# Patient Record
Sex: Female | Born: 1935 | Race: White | Hispanic: No | Smoking: Never smoker
Health system: Southern US, Community
[De-identification: ages and names within clinical notes are randomized; demographics above are authoritative.]

## PROBLEM LIST (undated history)

## (undated) DIAGNOSIS — M1990 Unspecified osteoarthritis, unspecified site: Secondary | ICD-10-CM

## (undated) DIAGNOSIS — C801 Malignant (primary) neoplasm, unspecified: Secondary | ICD-10-CM

## (undated) DIAGNOSIS — E7800 Pure hypercholesterolemia, unspecified: Secondary | ICD-10-CM

## (undated) DIAGNOSIS — F32A Depression, unspecified: Secondary | ICD-10-CM

## (undated) DIAGNOSIS — M545 Low back pain, unspecified: Secondary | ICD-10-CM

## (undated) DIAGNOSIS — F329 Major depressive disorder, single episode, unspecified: Secondary | ICD-10-CM

## (undated) DIAGNOSIS — F419 Anxiety disorder, unspecified: Secondary | ICD-10-CM

## (undated) DIAGNOSIS — K219 Gastro-esophageal reflux disease without esophagitis: Secondary | ICD-10-CM

## (undated) HISTORY — PX: FRACTURE SURGERY: SHX138

## (undated) HISTORY — PX: EYE SURGERY: SHX253

## (undated) HISTORY — PX: COLONOSCOPY: SHX174

## (undated) HISTORY — PX: CATARACT EXTRACTION, BILATERAL: SHX1313

## (undated) HISTORY — PX: JOINT REPLACEMENT: SHX530

## (undated) HISTORY — PX: DILATION AND CURETTAGE OF UTERUS: SHX78

---

## 2005-04-27 ENCOUNTER — Inpatient Hospital Stay: Payer: Self-pay | Admitting: Specialist

## 2005-04-27 ENCOUNTER — Other Ambulatory Visit: Payer: Self-pay

## 2011-06-16 ENCOUNTER — Ambulatory Visit: Payer: Self-pay | Admitting: Nurse Practitioner

## 2014-04-22 ENCOUNTER — Ambulatory Visit: Payer: Self-pay | Admitting: Obstetrics & Gynecology

## 2014-04-22 LAB — BASIC METABOLIC PANEL
Anion Gap: 8 (ref 7–16)
BUN: 11 mg/dL (ref 7–18)
CALCIUM: 8.9 mg/dL (ref 8.5–10.1)
CHLORIDE: 106 mmol/L (ref 98–107)
CREATININE: 0.85 mg/dL (ref 0.60–1.30)
Co2: 28 mmol/L (ref 21–32)
EGFR (African American): >60
Glucose: 95 mg/dL (ref 65–99)
OSMOLALITY: 282 (ref 275–301)
POTASSIUM: 3.8 mmol/L (ref 3.5–5.1)
Sodium: 142 mmol/L (ref 136–145)

## 2014-04-22 LAB — HEMOGLOBIN: HGB: 13.6 g/dL (ref 12.0–16.0)

## 2014-04-24 ENCOUNTER — Ambulatory Visit: Payer: Self-pay | Admitting: Obstetrics & Gynecology

## 2014-10-10 NOTE — Op Note (Signed)
PATIENT NAME:  Molly Fisher, Molly Fisher MR#:  [id number] DATE OF BIRTH:  [birth year]  DATE OF PROCEDURE:  04/24/2014  PREOPERATIVE DIAGNOSIS:  Postmenopausal bleeding.  POSTPARTUM DIAGNOSIS: Postmenopausal bleeding.  SURGEON:  Larey Days, MD.  ANESTHESIA:  LMA.  PROCEDURE:   1. Dilation and curettage. 2. Hysteroscopy. 3. Cystoscopy.  FLUIDS:  In were crystalloids, urine out was zero.  BLOOD LOSS: Minimal.   SPECIMENS:  Endometrial curettings.   FINDINGS:   1. Fragile tissue in the vagina and external cervix. 2. Uterus sounded to 7 mm. 3. Atrophic endometrium. 4. Scant endometrial curettings. 5. Normal appearing bladder with bilateral ureteral jets and increased vascularity at the trigone. 6. Very small, less than 1 mm, pale-colored nodule near the left UO.  DISPOSITION:  Stable to PACU.  INDICATIONS FOR PROCEDURE: The patient is an 81 year old female who presented as an outpatient for workup of postmenopausal bleeding. The bleeding was described as bright red and few dots versus dark red or brown and voluminous, leading to the question as to whether this was from the endometrium or perhaps the external genitalia or urethra. The patient has been incontinent with no sensation of urine expression for many years. An ultrasound was performed that showed a thickened endometrial stripe at 6 to 7 mm as well as an echogenic finding within the endometrium. Due to this, the patient was recommended a sampling of the uterus, and due to the finding of the echogenic material, it was recommended for a hysteroscopic evaluation. Due to the nature of the bleeding and its uncertainty as well as her past for urinary incontinence, the possibility that this was urinary or urethral in nature existed, thus the indication for the cystoscopy.   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 She was moved to the operating table. She was given  general anesthesia via LMA and once she was adequately sedated, she was placed in the dorsal lithotomy position using Allen stirrups. She was prepped and draped in the usual sterile fashion using Betadine. A sterile vaginal and pelvic exam was performed, noting an anteverted uterus with good mobility. No rectovaginal nodularity and no pelvic fullness or adnexal masses. A surgical timeout was called. The gray speculum was placed into the vagina, where upon opening it, it was noted that the patient was bleeding from the ectocervix. A sponge stick was used to wipe away the blood in order to delineate its origin.   The external surface of the cervix has been abraded during the prep and was bleeding superficially. There was no blood coming from the cervical os. Pratt dilators were used to identify the cervical os and the uterus was sounded to 7 cm. Again, Pratt dilators were used sequentially to dilate the cervix in order to accommodate the hysteroscope, which was inserted and the uterine cavity was visualized with findings as above. The hysteroscope was removed and then gentle curettage was performed with a scan amount of tissue returned. This was handed off as endometrial curettings. The hysteroscope was then reinserted to confirm that there were no remaining areas of tissue that could be removed. There were no polyps or solid material within the cavity.  Once this was complete, the hysteroscope was exchanged for a cystoscope. This was then inserted into the urethra and 300 mL of normal saline was irrigated into the bladder. The dome and the surrounding walls of the bladder were evaluated and found to be normal. The trigone, however,  had increased vascularity and to the left of the ureteral orifice was a very small, pale nodule that had no vascularity to it. Both ureteral orifices were found to have efflux and the hysteroscope was then removed. A red rubber catheter was then placed into the bladder in order to  evacuate the remaining fluid. That was the end of the procedure. The patient was cleaned. She tolerated the procedure. The counts were correct x 2. She was recovered and brought to PACU in a stable condition from recovery.  Please note, the patient had an episode of bradycardia soon after induction of anesthesia. No atropine was used. She recovered spontaneously and throughout the remainder of the case, maintained a normal heart rate.         ____________________________ Honor Loh. Ward, MD ccw:TT D: 04/24/2014 15:09:19 ET T: 04/24/2014 15:22:14 ET JOB#: [id number]  cc: Chelsea C. Ward, MD, <Dictator> Maceo Pro MD ELECTRONICALLY SIGNED 05/05/2014 9:22

## 2014-10-12 LAB — SURGICAL PATHOLOGY

## 2014-12-25 ENCOUNTER — Emergency Department: Payer: Medicare Other

## 2014-12-25 ENCOUNTER — Emergency Department
Admission: EM | Admit: 2014-12-25 | Discharge: 2014-12-25 | Disposition: A | Discharge status: Home / Self Care | Payer: Medicare Other | Attending: Emergency Medicine | Admitting: Emergency Medicine

## 2014-12-25 DIAGNOSIS — S0093XA Contusion of unspecified part of head, initial encounter: Secondary | ICD-10-CM | POA: Insufficient documentation

## 2014-12-25 DIAGNOSIS — Y998 Other external cause status: Secondary | ICD-10-CM | POA: Insufficient documentation

## 2014-12-25 DIAGNOSIS — Y9389 Activity, other specified: Secondary | ICD-10-CM | POA: Insufficient documentation

## 2014-12-25 DIAGNOSIS — W01198A Fall on same level from slipping, tripping and stumbling with subsequent striking against other object, initial encounter: Secondary | ICD-10-CM | POA: Insufficient documentation

## 2014-12-25 DIAGNOSIS — S01111A Laceration without foreign body of right eyelid and periocular area, initial encounter: Secondary | ICD-10-CM | POA: Diagnosis not present

## 2014-12-25 DIAGNOSIS — S0990XA Unspecified injury of head, initial encounter: Secondary | ICD-10-CM | POA: Diagnosis present

## 2014-12-25 DIAGNOSIS — Y9289 Other specified places as the place of occurrence of the external cause: Secondary | ICD-10-CM | POA: Insufficient documentation

## 2014-12-25 DIAGNOSIS — H05231 Hemorrhage of right orbit: Secondary | ICD-10-CM

## 2014-12-25 MED ORDER — DIAZEPAM 5 MG/ML IJ SOLN
INTRAMUSCULAR | Status: AC
  Filled 2014-12-25: qty 2

## 2014-12-25 MED ORDER — DIAZEPAM 5 MG/ML IJ SOLN
5.0000 mg | Freq: Once | INTRAMUSCULAR | Status: AC
  Administered 2014-12-25: 5 mg via INTRAMUSCULAR

## 2014-12-25 MED ORDER — DIAZEPAM 5 MG/ML IJ SOLN
INTRAMUSCULAR | Status: AC
  Administered 2014-12-25: 5 mg via INTRAMUSCULAR
  Filled 2014-12-25: qty 2

## 2014-12-25 MED ORDER — LIDOCAINE-EPINEPHRINE-TETRACAINE (LET) SOLUTION
3.0000 mL | Freq: Once | NASAL | Status: AC
  Administered 2014-12-25: 3 mL via TOPICAL

## 2014-12-25 MED ORDER — LIDOCAINE-EPINEPHRINE-TETRACAINE (LET) SOLUTION
NASAL | Status: AC
  Administered 2014-12-25: 3 mL via TOPICAL
  Filled 2014-12-25: qty 3

## 2014-12-25 NOTE — ED Notes (Signed)
Patient reports she was walking and the wind was blowing and she got turned around and fell striking head on carport.  Pt denies loss of consciousness.  Pt with laceration noted above right eye brow, bruising and swelling noted around right eye.  Pt denies taking any type of blood thinner.

## 2014-12-25 NOTE — Discharge Instructions (Signed)
Contusion A contusion is a deep bruise. Contusions are the result of an injury that caused bleeding under the skin. The contusion may turn blue, purple, or yellow. Minor injuries will give you a painless contusion, but more severe contusions may stay painful and swollen for a few weeks.  CAUSES  A contusion is usually caused by a blow, trauma, or direct force to an area of the body. SYMPTOMS   Swelling and redness of the injured area.  Bruising of the injured area.  Tenderness and soreness of the injured area.  Pain. DIAGNOSIS  The diagnosis can be made by taking a history and physical exam. An X-ray, CT scan, or MRI may be needed to determine if there were any associated injuries, such as fractures. TREATMENT  Specific treatment will depend on what area of the body was injured. In general, the best treatment for a contusion is resting, icing, elevating, and applying cold compresses to the injured area. Over-the-counter medicines may also be recommended for pain control. Ask your caregiver what the best treatment is for your contusion. HOME CARE INSTRUCTIONS   Put ice on the injured area.  Put ice in a plastic bag.  Place a towel between your skin and the bag.  Leave the ice on for 15-20 minutes, 3-4 times a day, or as directed by your health care provider.  Only take over-the-counter or prescription medicines for pain, discomfort, or fever as directed by your caregiver. Your caregiver may recommend avoiding anti-inflammatory medicines (aspirin, ibuprofen, and naproxen) for 48 hours because these medicines may increase bruising.  Rest the injured area.  If possible, elevate the injured area to reduce swelling. SEEK IMMEDIATE MEDICAL CARE IF:   You have increased bruising or swelling.  You have pain that is getting worse.  Your swelling or pain is not relieved with medicines. MAKE SURE YOU:   Understand these instructions.  Will watch your condition.  Will get help right  away if you are not doing well or get worse. Document Released: 03/15/2005 Document Revised: 06/10/2013 Document Reviewed: 04/10/2011 [facility] Patient Information 2015 [place], [place]. This information is not intended to replace advice given to you by your health care provider. Make sure you discuss any questions you have with your health care provider.  Facial Laceration  A facial laceration is a cut on the face. These injuries can be painful and cause bleeding. Lacerations usually heal quickly, but they need special care to reduce scarring. DIAGNOSIS  Your health care provider will take a medical history, ask for details about how the injury occurred, and examine the wound to determine how deep the cut is. TREATMENT  Some facial lacerations may not require closure. Others may not be able to be closed because of an increased risk of infection. The risk of infection and the chance for successful closure will depend on various factors, including the amount of time since the injury occurred. The wound may be cleaned to help prevent infection. If closure is appropriate, pain medicines may be given if needed. Your health care provider will use stitches (sutures), wound glue (adhesive), or skin adhesive strips to repair the laceration. These tools bring the skin edges together to allow for faster healing and a better cosmetic outcome. If needed, you may also be given a tetanus shot. HOME CARE INSTRUCTIONS  Only take over-the-counter or prescription medicines as directed by your health care provider.  Follow your health care provider's instructions for wound care. These instructions will vary depending on the technique  used for closing the wound. For Sutures:  Keep the wound clean and dry.   If you were given a bandage (dressing), you should change it at least once a day. Also change the dressing if it becomes wet or dirty, or as directed by your health care provider.   Wash the wound with soap and  water 2 times a day. Rinse the wound off with water to remove all soap. Pat the wound dry with a clean towel.   After cleaning, apply a thin layer of the antibiotic ointment recommended by your health care provider. This will help prevent infection and keep the dressing from sticking.   You may shower as usual after the first 24 hours. Do not soak the wound in water until the sutures are removed.   Get your sutures removed as directed by your health care provider. With facial lacerations, sutures should usually be taken out after 4-5 days to avoid stitch marks.   Wait a few days after your sutures are removed before applying any makeup. For Skin Adhesive Strips:  Keep the wound clean and dry.   Do not get the skin adhesive strips wet. You may bathe carefully, using caution to keep the wound dry.   If the wound gets wet, pat it dry with a clean towel.   Skin adhesive strips will fall off on their own. You may trim the strips as the wound heals. Do not remove skin adhesive strips that are still stuck to the wound. They will fall off in time.  For Wound Adhesive:  You may briefly wet your wound in the shower or bath. Do not soak or scrub the wound. Do not swim. Avoid periods of heavy sweating until the skin adhesive has fallen off on its own. After showering or bathing, gently pat the wound dry with a clean towel.   Do not apply liquid medicine, cream medicine, ointment medicine, or makeup to your wound while the skin adhesive is in place. This may loosen the film before your wound is healed.   If a dressing is placed over the wound, be careful not to apply tape directly over the skin adhesive. This may cause the adhesive to be pulled off before the wound is healed.   Avoid prolonged exposure to sunlight or tanning lamps while the skin adhesive is in place.  The skin adhesive will usually remain in place for 5-10 days, then naturally fall off the skin. Do not pick at the adhesive  film.  After Healing: Once the wound has healed, cover the wound with sunscreen during the day for 1 full year. This can help minimize scarring. Exposure to ultraviolet light in the first year will darken the scar. It can take 1-2 years for the scar to lose its redness and to heal completely.  SEEK IMMEDIATE MEDICAL CARE IF:  You have redness, pain, or swelling around the wound.   You see ayellowish-white fluid (pus) coming from the wound.   You have chills or a fever.  MAKE SURE YOU:  Understand these instructions.  Will watch your condition.  Will get help right away if you are not doing well or get worse. Document Released: 07/13/2004 Document Revised: 03/26/2013 Document Reviewed: 01/16/2013 [facility] Patient Information 2015 [place], [place]. This information is not intended to replace advice given to you by your health care provider. Make sure you discuss any questions you have with your health care provider.

## 2014-12-25 NOTE — ED Provider Notes (Signed)
CSN: [id number]     Arrival date & time 12/25/14  2038 History   First MD Initiated Contact with Patient 12/25/14 2121     Chief Complaint  Patient presents with  . Fall  . Head Laceration     (Consider location/radiation/quality/duration/timing/severity/associated sxs/prior Treatment) HPI  81 year old female presents with family for evaluation of head trauma. Just prior to arrival today, she was walking her trashcan to the curb when the wind blew and caused her to fall over. Patient hit the right side of her forehead. She did not lose consciousness. She was able to get up, ambulate with no pain or discomfort in the upper or lower extremities. Patient suffered a laceration to the right brow. He denies any headache, loss of consciousness, nausea, vomiting. Is able to ambulate into the ER as well as in the room. No dizziness, chest pain, or shortness of breath, neck or back pain.  No past medical history on file. No past surgical history on file. No family history on file. History  Substance Use Topics  . Smoking status: Not on file  . Smokeless tobacco: Not on file  . Alcohol Use: Not on file   OB History    No data available     Review of Systems  Constitutional: Negative for fever, chills, activity change and fatigue.  HENT: Negative for congestion, sinus pressure and sore throat.   Eyes: Negative for visual disturbance.  Respiratory: Negative for cough, chest tightness and shortness of breath.   Cardiovascular: Negative for chest pain and leg swelling.  Gastrointestinal: Negative for nausea, vomiting, abdominal pain and diarrhea.  Genitourinary: Negative for dysuria.  Musculoskeletal: Negative for arthralgias and gait problem.  Skin: Positive for wound.  Neurological: Negative for weakness, numbness and headaches.  Hematological: Negative for adenopathy.  Psychiatric/Behavioral: Negative for behavioral problems, confusion and agitation.      Allergies  Review of  patient's allergies indicates no known allergies.  Home Medications   Prior to Admission medications   Not on File   BP 193/84 mmHg  Pulse 90  Temp(Src) 98.1 F (36.7 C) (Oral)  Resp 20  Ht 5\' 7"  (1.702 m)  Wt 167 lb (75.751 kg)  BMI 26.15 kg/m2  SpO2 96% Physical Exam  Constitutional: She is oriented to person, place, and time. She appears well-developed and well-nourished. No distress.  HENT:  Head: Normocephalic.  Mouth/Throat: Oropharynx is clear and moist.  Hematoma with ecchymosis along the right brow. 3 cm laceration above the right brow. Area is tender to palpation. No periorbital tenderness. Full extraocular movement.  Eyes: EOM are normal. Pupils are equal, round, and reactive to light. Right eye exhibits no discharge. Left eye exhibits no discharge.  Neck: Normal range of motion. Neck supple.  Cardiovascular: Normal rate, regular rhythm and intact distal pulses.   Pulmonary/Chest: Effort normal and breath sounds normal. No respiratory distress. She exhibits no tenderness.  Abdominal: Soft. She exhibits no distension. There is no tenderness.  Musculoskeletal: Normal range of motion. She exhibits no edema.  Normal range of motion of the neck and lumbar spine hips and shoulders. No pain with active or passive range of motion.  Neurological: She is alert and oriented to person, place, and time. She has normal reflexes.  Skin: Skin is warm and dry.  3 cm laceration just above the right brow. Mild ecchymosis and swelling  Psychiatric: She has a normal mood and affect. Her behavior is normal. Thought content normal.    ED Course  Procedures (  including critical care time) LACERATION REPAIR Performed by: Feliberto Gottron Authorized by: Feliberto Gottron Consent: Verbal consent obtained. Risks and benefits: risks, benefits and alternatives were discussed Consent given by: patient Patient identity confirmed: provided demographic data Prepped and Draped  in normal sterile fashion Wound explored  Laceration Location: Right brow  Laceration Length: 4 cm  No Foreign Bodies seen or palpated  Anesthesia: local infiltration  Local anesthetic: LET  Irrigation method: syringe Amount of cleaning: standard  Skin closure: 6-0 nylon   Number of sutures: 4   Technique: Simple interrupted   Patient tolerance: Patient tolerated the procedure well with no immediate complications. Labs Review Labs Reviewed - No data to display  Imaging Review Ct Head Wo Contrast  12/25/2014   CLINICAL DATA:  81 year old female with head injury and headache following fall today. Initial encounter.  EXAM: CT HEAD WITHOUT CONTRAST  TECHNIQUE: Contiguous axial images were obtained from the base of the skull through the vertex without intravenous contrast.  COMPARISON:  None.  FINDINGS: Mild chronic small-vessel white matter ischemic changes are noted.  No acute intracranial abnormalities are identified, including mass lesion or mass effect, hydrocephalus, extra-axial fluid collection, midline shift, hemorrhage, or acute infarction.  The visualized bony calvarium is unremarkable.  Right forehead soft tissue swelling is identified.  IMPRESSION: No evidence of acute intracranial abnormality.  Right forehead soft tissue swelling without fracture.  Mild chronic small-vessel white matter ischemic changes.   Electronically Signed   By: Margarette Canada M.D.   On: 12/25/2014 22:04     EKG Interpretation None      MDM   Final diagnoses:  Contusion of head, initial encounter  Laceration of eyebrow, right, initial encounter  Periorbital hematoma of right eye    81 year old female status post fall, laceration to right eyebrow. No loss of consciousness, headache, nausea, vomiting. CT of the head negative. Laceration was repaired with sutures. She'll follow-up in 5-6 days for suture removal. Tetanus was updated. Educated on red flags to return to the ER for. Ice periorbital  hematoma.  Duanne Guess, PA-C 12/25/14 2225  Lavonia Drafts, MD 12/25/14 [phone]

## 2014-12-25 NOTE — ED Notes (Signed)
Pt. States she was outside and fell when a strong wind came up and pt. Fell on rt. Side.  Pt. Has 2 cm laceration above rt. Eye.  Pt. Denies pain to rt. Side.  Pt. Has bruising above rt. Eye including rt. eye lid.  Pt. Denies change in vision to rt. Eye.

## 2014-12-25 NOTE — ED Notes (Signed)
Patient with no complaints at this time. Respirations even and unlabored. Skin warm/dry. Discharge instructions reviewed with patient at this time. Patient given opportunity to voice concerns/ask questions. Patient discharged at this time and left Emergency Department, via wheelchair.   

## 2015-08-11 ENCOUNTER — Encounter
Admission: RE | Admit: 2015-08-11 | Discharge: 2015-08-11 | Disposition: A | Discharge status: Home / Self Care | Payer: Medicare Other | Source: Ambulatory Visit | Attending: Orthopedic Surgery | Admitting: Orthopedic Surgery

## 2015-08-11 DIAGNOSIS — G8929 Other chronic pain: Secondary | ICD-10-CM | POA: Diagnosis not present

## 2015-08-11 DIAGNOSIS — Z01812 Encounter for preprocedural laboratory examination: Secondary | ICD-10-CM | POA: Insufficient documentation

## 2015-08-11 DIAGNOSIS — E7800 Pure hypercholesterolemia, unspecified: Secondary | ICD-10-CM | POA: Diagnosis not present

## 2015-08-11 DIAGNOSIS — M25562 Pain in left knee: Secondary | ICD-10-CM | POA: Diagnosis not present

## 2015-08-11 DIAGNOSIS — Z01810 Encounter for preprocedural cardiovascular examination: Secondary | ICD-10-CM | POA: Diagnosis present

## 2015-08-11 HISTORY — DX: Gastro-esophageal reflux disease without esophagitis: K21.9

## 2015-08-11 HISTORY — DX: Pure hypercholesterolemia, unspecified: E78.00

## 2015-08-11 HISTORY — DX: Anxiety disorder, unspecified: F41.9

## 2015-08-11 HISTORY — DX: Malignant (primary) neoplasm, unspecified: C80.1

## 2015-08-11 LAB — URINALYSIS COMPLETE WITH MICROSCOPIC (ARMC ONLY)
Bilirubin Urine: NEGATIVE
Glucose, UA: NEGATIVE mg/dL
HGB URINE DIPSTICK: NEGATIVE
Ketones, ur: NEGATIVE mg/dL
Nitrite: NEGATIVE
Protein, ur: 30 mg/dL — AB
Specific Gravity, Urine: 1.021 (ref 1.005–1.030)
pH: 6 (ref 5.0–8.0)

## 2015-08-11 LAB — BASIC METABOLIC PANEL
Anion gap: 7 (ref 5–15)
BUN: 15 mg/dL (ref 6–20)
CO2: 28 mmol/L (ref 22–32)
Calcium: 9.3 mg/dL (ref 8.9–10.3)
Chloride: 106 mmol/L (ref 101–111)
Creatinine, Ser: 0.75 mg/dL (ref 0.44–1.00)
GFR calc Af Amer: >60 mL/min (ref >60)
GFR calc non Af Amer: >60 mL/min (ref >60)
Glucose, Bld: 114 mg/dL — ABNORMAL HIGH (ref 65–99)
Potassium: 3.5 mmol/L (ref 3.5–5.1)
Sodium: 141 mmol/L (ref 135–145)

## 2015-08-11 LAB — PROTIME-INR
INR: 1.3
Prothrombin Time: 16.3 seconds — ABNORMAL HIGH (ref 11.4–15.0)

## 2015-08-11 LAB — SEDIMENTATION RATE: Sed Rate: 33 mm/hr — ABNORMAL HIGH (ref 0–30)

## 2015-08-11 LAB — TYPE AND SCREEN
ABO/RH(D): O POS
ANTIBODY SCREEN: NEGATIVE

## 2015-08-11 LAB — CBC
HCT: 37.9 % (ref 35.0–47.0)
Hemoglobin: 12.9 g/dL (ref 12.0–16.0)
MCH: 28.1 pg (ref 26.0–34.0)
MCHC: 34 g/dL (ref 32.0–36.0)
MCV: 82.6 fL (ref 80.0–100.0)
PLATELETS: 224 10*3/uL (ref 150–440)
RBC: 4.59 MIL/uL (ref 3.80–5.20)
RDW: 14.7 % — AB (ref 11.5–14.5)
WBC: 6.6 10*3/uL (ref 3.6–11.0)

## 2015-08-11 LAB — ABO/RH: ABO/RH(D): O POS

## 2015-08-11 LAB — SURGICAL PCR SCREEN
MRSA, PCR: NEGATIVE
Staphylococcus aureus: POSITIVE — AB

## 2015-08-11 LAB — APTT: aPTT: 28 seconds (ref 24–36)

## 2015-08-11 NOTE — Patient Instructions (Signed)
  Your procedure is scheduled on: Wednesday 08/25/15  Report to Day Surgery. 2ND FLOOR MEDICAL MALL ENTRANCE To find out your arrival time please call [phone] between 1PM - 3PM on Tuesday 08/24/15.  Remember: Instructions that are not followed completely may result in serious medical risk, up to and including death, or upon the discretion of your surgeon and anesthesiologist your surgery may need to be rescheduled.    __X__ 1. Do not eat food or drink liquids after midnight. No gum chewing or hard candies.     __X__ 2. No Alcohol for 24 hours before or after surgery.   ____ 3. Bring all medications with you on the day of surgery if instructed.    __X__ 4. Notify your doctor if there is any change in your medical condition     (cold, fever, infections).     Do not wear jewelry, make-up, hairpins, clips or nail polish.  Do not wear lotions, powders, or perfumes.   Do not shave 48 hours prior to surgery. Men may shave face and neck.  Do not bring valuables to the hospital.    [facility] is not responsible for any belongings or valuables.               Contacts, dentures or bridgework may not be worn into surgery.  Leave your suitcase in the car. After surgery it may be brought to your room.  For patients admitted to the hospital, discharge time is determined by your                treatment team.   Patients discharged the day of surgery will not be allowed to drive home.   Please read over the following fact sheets that you were given:   MRSA Information and Surgical Site Infection Prevention   __X__ Take these medicines the morning of surgery with A SIP OF WATER:    1. YOU MAY TAKE YOUR LORAZEPAM FOR ANXIETY AND/OR YOUR TRAMADOL FOR PAIN IF NEEDED  2.   3.   4.  5.  6.  ____ Fleet Enema (as directed)   __X__ Use CHG Soap as directed  ____ Use inhalers on the day of surgery  ____ Stop metformin 2 days prior to surgery    ____ Take 1/2 of usual insulin dose the night  before surgery and none on the morning of surgery.   ____ Stop Coumadin/Plavix/aspirin on   ____ Stop Anti-inflammatories on    __X__ Stop supplements until after surgery.  (BIOTIN, VITAMIN C)  ____ Bring C-Pap to the hospital.

## 2015-08-11 NOTE — Pre-Procedure Instructions (Signed)
Echocardiogram and Stress Test Results from 11/28/13.   Result Narrative                      CARDIOLOGY DEPARTMENT                        [facility]                           [id number]                   [place] #: [id number]         [address] [place], [place] [address]             Date: 11/28/2013 02:10 PM                                                                  Adult Female Age: 82 yrs                     ECHOCARDIOGRAM REPORT                        Outpatient                                                                  KC::KCWC          STUDY:CHEST WALL         TAPE:0000:00: 0:00:00          MD1:  M. ANDERSON           ECHO:Yes   DOPPLER:Yes  FILE:0000-000-000              BP: 120/70 mmHg          COLOR:Yes  CONTRAST:No       MACHINE:[place]            HR: 65      RV BIOPSY:No         3D:No    SOUND QLTY:Moderate           Height: 66 in         MEDIUM:None                                              Weight: 178 lb  BSA: 1.9 m2  ___________________________________________________________________________________________                  HISTORY:DOE                   REASON:Assess, LV function               INDICATION:786.05 Shortness of breath  ___________________________________________________________________________________________ ECHOCARDIOGRAPHIC MEASUREMENTS 2D DIMENSIONS AORTA             Values     Normal Range       MAIN PA          Values      Normal Range           Annulus:  1.8 cm   [2.1 - 2.5]                 PA Main:  nm*       [1.5 - 2.1]         Aorta Sin:  nm*      [2.7 - 3.3]        RIGHT VENTRICLE       ST Junction:  nm*      [2.3 - 2.9]                 RV Base:  nm*       [ < 4.2]         Asc.Aorta:  nm*      [2.3 - 3.1]                  RV Mid:  nm*       [ < 3.5]  LEFT VENTRICLE                                         RV Length:  nm*       [ < 8.6]             LVIDd:  4.1 cm   [3.9 - 5.3]        INFERIOR VENA CAVA             LVIDs:  3.0 cm                              Max. IVC:  nm*       [ <= 2.1]                FS:  26.9 %   [> 25]                     Min. IVC:  nm*               SWT:  1.1 cm   [0.5 - 0.9]                    ------------------               PWT:  0.99 cm  [0.5 - 0.9]                    nm* - not measured  LEFT ATRIUM           LA Diam:  3.3 cm   [2.7 - 3.8]       LA A4C Area:  nm*      [ <  20]         LA Volume:  nm*      [22 - 52]  ___________________________________________________________________________________________ ECHOCARDIOGRAPHIC DESCRIPTIONS  AORTIC ROOT         Size:Normal   Dissection:No dissection  AORTIC VALVE     Leaflets:Tricuspid         Morphology:Normal     Mobility:Fully mobile  LEFT VENTRICLE         Size:Normal              Anterior:Normal  Contraction:Normal               Lateral:Normal   Closest EF:>55% (Estimated)      Septal:Normal    LV Masses:No Masses             Apical:Normal          [place]:[id number] LVH            Inferior:Normal                                 Posterior:Normal Dias.FxClass:N/A  MITRAL VALVE     Leaflets:Normal              Mobility:Fully mobile   Morphology:Normal  LEFT ATRIUM         Size:Normal             LA Masses:No masses    IA Septum:Normal IAS  MAIN PA         Size:Normal  PULMONIC VALVE   Morphology:Normal              Mobility:Fully mobile  RIGHT VENTRICLE    RV Masses:No Masses               Size:Normal    Free Wall:Normal           Contraction:Normal  TRICUSPID VALVE     Leaflets:Normal              Mobility:Fully mobile   Morphology:Normal  RIGHT ATRIUM         Size:Normal              RA Other:None      RA Mass:No masses  PERICARDIUM        Fluid:TRIVIAL EFFUSION  INFERIOR VENACAVA         Size:Normal Normal respiratory  collapse   _____________________________________________________ DOPPLER ECHO and OTHER SPECIAL PROCEDURES    Aortic:No AR                 No AS     Mitral:MILD MR               No MS           MV Inflow E Vel=nm*   MV Annulus E'Vel=nm*           E/E'Ratio=nm*  Tricuspid:MILD TR               No TS  Pulmonary:No PR                 No PS    ___________________________________________________________________________________________  INTERPRETATION NORMAL LEFT VENTRICULAR SYSTOLIC FUNCTION NORMAL RIGHT VENTRICULAR SYSTOLIC FUNCTION MILD VALVULAR REGURGITATION (See above) NO VALVULAR STENOSIS No Apical views  ___________________________________________________________________________________________  Electronically signed by: MD Serafina Royals on 12/01/2013 08:24 AM             Performed By: Rennis Chris, RDCS, RVT  Ordering Physician: Serafina Royals     NM myocardial perfusion SPECT multiple (stress and rest) (11/28/2013 3:10 PM) NM myocardial perfusion SPECT multiple (stress and rest) (11/28/2013 3:10 PM)  Impressions  Indeterminate Lexis scan infusion Myoview due to baseline EKG changes   Normal myocardial perfusion without evidence of myocardial ischemia    Serafina Royals            NM myocardial perfusion SPECT multiple (stress and rest) (11/28/2013 3:10 PM)  Narrative  Procedure: Pharmacologic Myocardial Perfusion Imaging  One day procedure    Indication: SOB (shortness of breath) - Plan: NM myocardial perfusion   SPECT multiple (stress and rest), ECG stress test only    Abnormal ECG - Plan: NM myocardial perfusion SPECT multiple (stress and   rest), ECG stress test only  Ordering Physician:     Dr. Serafina Royals      Clinical History:  82 y.o. year old female  Vitals: Height: 67 in Weight: 173 lb  Cardiac risk factors include:    -HLD  -FAM HX CAD      Procedure:    Pharmacologic stress  testing was performed with Regadenoson using a single   use 0.4mg /52ml (0.08 mg/ml) prefilled syringe intravenously infused as a   bolus dose. The stress test was stopped due to Infusion completion.Blood   pressure response was hypotensive.    Rest HR: 63bpm  Rest BP: 150/59mmHg  Max HR: 100bpm  Min BP: 138/10mmHg    Stress Test Administered by: Oswald Hillock, CMA    ECG Interpretation:  Rest [id number] sinus rhythm, left bundle branch block (LBBB)  Stress [id number] sinus rhythm,     Recovery [id number] sinus rhythm  ECG Interpretation:non-diagnostic due to pharmacologic testing.      Administrations This Visit   regadenoson (LEXISCAN) 0.4 mg/5 mL inj syringe 0.4 mgAdministered   Action Dose Route Administered By06/05/2014 Given 0.4 mg Intravenous   Joshua B Hyler, CNMT   technetium Tc80m sestamibi (CARDIOLITE) injection 14 millicurie  Administered Action Dose Route Administered 0000000 Given 14   millicurie Intravenous Joshua B Hyler, CNMT   technetium Tc25m sestamibi (CARDIOLITE) injection 123XX123 millicurie  Administered Action Dose Route Administered By06/05/2014 Given 123XX123   millicurie Intravenous Joshua B Hyler, CNMT         Gated post-stress perfusion imaging was performed 30 minutes after stress.   Rest images were performed 30 minutes after injection.    Gated LV Analysis:   TID Ratio: no    LVEF= 69%    FINDINGS:  Regional wall motion:reveals normal myocardial thickening and wall   motion.  The overall quality of the study is good.  Artifacts noted: no  Left ventricular cavity: normal.    Perfusion Analysis:SPECT images demonstrate homogeneous tracer   distribution throughout the myocardium.     EKG Results - in this encounter   ECG stress test only (11/28/2013 9:48 AM) Visit Diagnoses - in this encounter Diagnosis  SOB (shortness of breath)  Shortness of  breath   Abnormal ECG  Nonspecific abnormal electrocardiogram (ECG) (EKG)   Administered Medications - in this encounter Inactive Administered Medications - up to 3 most recent administrations Inactive Administered Medications - up to 3 most recent administrations   Medication Order [facility] Action Action Date Dose Rate Site   regadenoson (LEXISCAN) 0.4 mg/5 mL inj syringe 0.4 mg  0.4 mg, Intravenous, Once, Fri 11/28/13 at 1000, For 1 dose, Radiology          Given 11/28/2013 09:53 EDT 0.4 mg  technetium Tc73m sestamibi (CARDIOLITE) injection 14 millicurie  14 millicurie, Intravenous, Once, Fri 11/28/13 at 1000, For 1 dose, Radiology          Given 11/28/2013 99991111 EDT 14 millicuries        technetium Tc4m sestamibi (CARDIOLITE) injection 123XX123 millicurie  123XX123 millicurie, Intravenous, Once, Fri 11/28/13 at 1300, For 1 dose, Radiology          Given 11/28/2013 99991111 EDT 123XX123 millicuries

## 2015-08-11 NOTE — Pre-Procedure Instructions (Signed)
Dr. Oren Section requesting clearance for abnormal EKG. Notified Hope at [facility] office and faxed request along with copy of todays EKG and EKG from 04/22/2014 for comparison.

## 2015-08-13 LAB — URINE CULTURE

## 2015-08-14 NOTE — Pre-Procedure Instructions (Signed)
Urine culture report sent to Dr. Marry Guan for review.

## 2015-08-23 NOTE — Pre-Procedure Instructions (Signed)
SPOKE WITH CINDY AT DR Maree Krabbe RE STATUS OF CLEARANCE

## 2015-08-25 ENCOUNTER — Inpatient Hospital Stay
Admission: RE | Admit: 2015-08-25 | Discharge: 2015-08-28 | DRG: 470 | Disposition: A | Discharge status: Skilled Nursing Facility | Payer: Medicare Other | Source: Ambulatory Visit | Attending: Orthopedic Surgery | Admitting: Orthopedic Surgery

## 2015-08-25 ENCOUNTER — Inpatient Hospital Stay: Payer: Medicare Other

## 2015-08-25 ENCOUNTER — Inpatient Hospital Stay: Payer: Medicare Other | Admitting: Anesthesiology

## 2015-08-25 ENCOUNTER — Encounter: Payer: Self-pay | Admitting: Orthopedic Surgery

## 2015-08-25 ENCOUNTER — Encounter
Admission: RE | Disposition: A | Discharge status: Skilled Nursing Facility | Payer: Self-pay | Source: Ambulatory Visit | Attending: Orthopedic Surgery

## 2015-08-25 DIAGNOSIS — Z79899 Other long term (current) drug therapy: Secondary | ICD-10-CM | POA: Diagnosis not present

## 2015-08-25 DIAGNOSIS — Z85828 Personal history of other malignant neoplasm of skin: Secondary | ICD-10-CM | POA: Diagnosis not present

## 2015-08-25 DIAGNOSIS — E7800 Pure hypercholesterolemia, unspecified: Secondary | ICD-10-CM | POA: Diagnosis present

## 2015-08-25 DIAGNOSIS — M1712 Unilateral primary osteoarthritis, left knee: Secondary | ICD-10-CM | POA: Diagnosis present

## 2015-08-25 DIAGNOSIS — F419 Anxiety disorder, unspecified: Secondary | ICD-10-CM | POA: Diagnosis present

## 2015-08-25 DIAGNOSIS — K219 Gastro-esophageal reflux disease without esophagitis: Secondary | ICD-10-CM | POA: Diagnosis present

## 2015-08-25 DIAGNOSIS — Z96659 Presence of unspecified artificial knee joint: Secondary | ICD-10-CM

## 2015-08-25 DIAGNOSIS — E785 Hyperlipidemia, unspecified: Secondary | ICD-10-CM | POA: Diagnosis present

## 2015-08-25 HISTORY — PX: KNEE ARTHROPLASTY: SHX992

## 2015-08-25 LAB — PROTIME-INR
INR: 1.33
Prothrombin Time: 16.6 seconds — ABNORMAL HIGH (ref 11.4–15.0)

## 2015-08-25 SURGERY — ARTHROPLASTY, KNEE, TOTAL, USING IMAGELESS COMPUTER-ASSISTED NAVIGATION
Anesthesia: Spinal | Site: Knee | Laterality: Left | Wound class: Clean

## 2015-08-25 MED ORDER — CELECOXIB 200 MG PO CAPS
200.0000 mg | ORAL_CAPSULE | Freq: Two times a day (BID) | ORAL | Status: DC
  Administered 2015-08-25 – 2015-08-28 (×5): 200 mg via ORAL
  Filled 2015-08-25 (×8): qty 1

## 2015-08-25 MED ORDER — CEFAZOLIN SODIUM-DEXTROSE 2-3 GM-% IV SOLR
2.0000 g | Freq: Four times a day (QID) | INTRAVENOUS | Status: AC
  Administered 2015-08-25 – 2015-08-26 (×4): 2 g via INTRAVENOUS
  Filled 2015-08-25 (×4): qty 50

## 2015-08-25 MED ORDER — NEOMYCIN-POLYMYXIN B GU 40-200000 IR SOLN
Status: DC | PRN
  Administered 2015-08-25: 14 mL

## 2015-08-25 MED ORDER — NEOMYCIN-POLYMYXIN B GU 40-200000 IR SOLN
Status: AC
  Filled 2015-08-25: qty 20

## 2015-08-25 MED ORDER — GLYCOPYRROLATE 0.2 MG/ML IJ SOLN
INTRAMUSCULAR | Status: DC | PRN
  Administered 2015-08-25: 0.2 mg via INTRAVENOUS

## 2015-08-25 MED ORDER — MENTHOL 3 MG MT LOZG: 1.0000 | LOZENGE | OROMUCOSAL | Status: DC | PRN

## 2015-08-25 MED ORDER — SODIUM CHLORIDE FLUSH 0.9 % IV SOLN
INTRAVENOUS | Status: AC
  Filled 2015-08-25: qty 20

## 2015-08-25 MED ORDER — SODIUM CHLORIDE 0.9 % IV SOLN
INTRAVENOUS | Status: DC | PRN
  Administered 2015-08-25: 60 mL

## 2015-08-25 MED ORDER — TRANEXAMIC ACID 1000 MG/10ML IV SOLN
1000.0000 mg | INTRAVENOUS | Status: AC
  Administered 2015-08-25: 1000 mg via INTRAVENOUS
  Filled 2015-08-25 (×2): qty 10

## 2015-08-25 MED ORDER — BUPIVACAINE LIPOSOME 1.3 % IJ SUSP
INTRAMUSCULAR | Status: AC
  Filled 2015-08-25: qty 20

## 2015-08-25 MED ORDER — FENTANYL CITRATE (PF) 100 MCG/2ML IJ SOLN
INTRAMUSCULAR | Status: DC | PRN
  Administered 2015-08-25 (×2): 50 ug via INTRAVENOUS

## 2015-08-25 MED ORDER — OXYCODONE HCL 5 MG PO TABS
5.0000 mg | ORAL_TABLET | ORAL | Status: DC | PRN
  Administered 2015-08-25 – 2015-08-26 (×3): 5 mg via ORAL
  Administered 2015-08-26: 10 mg via ORAL
  Administered 2015-08-27: 5 mg via ORAL
  Administered 2015-08-27: 10 mg via ORAL
  Administered 2015-08-28: 5 mg via ORAL
  Filled 2015-08-25: qty 2
  Filled 2015-08-25 (×4): qty 1
  Filled 2015-08-25: qty 2
  Filled 2015-08-25: qty 1

## 2015-08-25 MED ORDER — ACETAMINOPHEN 325 MG PO TABS: 650.0000 mg | ORAL_TABLET | Freq: Four times a day (QID) | ORAL | Status: DC | PRN

## 2015-08-25 MED ORDER — PHENOL 1.4 % MT LIQD: 1.0000 | OROMUCOSAL | Status: DC | PRN

## 2015-08-25 MED ORDER — FAMOTIDINE 20 MG PO TABS
ORAL_TABLET | ORAL | Status: AC
  Filled 2015-08-25: qty 1

## 2015-08-25 MED ORDER — BUPIVACAINE-EPINEPHRINE (PF) 0.25% -1:200000 IJ SOLN
INTRAMUSCULAR | Status: AC
  Filled 2015-08-25: qty 30

## 2015-08-25 MED ORDER — SODIUM CHLORIDE 0.9 % IV SOLN
INTRAVENOUS | Status: DC
  Administered 2015-08-25: 18:00:00 via INTRAVENOUS

## 2015-08-25 MED ORDER — ACETAMINOPHEN 10 MG/ML IV SOLN
INTRAVENOUS | Status: AC
  Filled 2015-08-25: qty 100

## 2015-08-25 MED ORDER — CEFAZOLIN SODIUM-DEXTROSE 2-3 GM-% IV SOLR
2.0000 g | Freq: Once | INTRAVENOUS | Status: AC
  Administered 2015-08-25: 2 g via INTRAVENOUS

## 2015-08-25 MED ORDER — SALINE SPRAY 0.65 % NA SOLN
1.0000 | NASAL | Status: DC | PRN
  Filled 2015-08-25: qty 44

## 2015-08-25 MED ORDER — ONDANSETRON HCL 4 MG/2ML IJ SOLN: 4.0000 mg | Freq: Four times a day (QID) | INTRAMUSCULAR | Status: DC | PRN

## 2015-08-25 MED ORDER — FERROUS SULFATE 325 (65 FE) MG PO TABS
325.0000 mg | ORAL_TABLET | Freq: Two times a day (BID) | ORAL | Status: DC
  Administered 2015-08-26 – 2015-08-28 (×5): 325 mg via ORAL
  Filled 2015-08-25 (×5): qty 1

## 2015-08-25 MED ORDER — PROPOFOL 500 MG/50ML IV EMUL
INTRAVENOUS | Status: DC | PRN
  Administered 2015-08-25: 25 ug/kg/min via INTRAVENOUS

## 2015-08-25 MED ORDER — LORAZEPAM 1 MG PO TABS
1.0000 mg | ORAL_TABLET | Freq: Two times a day (BID) | ORAL | Status: DC | PRN
  Administered 2015-08-25 – 2015-08-27 (×2): 1 mg via ORAL
  Filled 2015-08-25 (×2): qty 1

## 2015-08-25 MED ORDER — CALCIUM CARBONATE ANTACID 500 MG PO CHEW: 1.5000 | CHEWABLE_TABLET | Freq: Every day | ORAL | Status: DC | PRN

## 2015-08-25 MED ORDER — VITAMIN C 500 MG PO TABS
500.0000 mg | ORAL_TABLET | Freq: Every day | ORAL | Status: DC
  Administered 2015-08-26 – 2015-08-28 (×3): 500 mg via ORAL
  Filled 2015-08-25 (×3): qty 1

## 2015-08-25 MED ORDER — MAGNESIUM HYDROXIDE 400 MG/5ML PO SUSP
30.0000 mL | Freq: Every day | ORAL | Status: DC | PRN
  Administered 2015-08-25 – 2015-08-26 (×2): 30 mL via ORAL
  Filled 2015-08-25 (×2): qty 30

## 2015-08-25 MED ORDER — SODIUM CHLORIDE 0.9 % IJ SOLN
INTRAMUSCULAR | Status: AC
  Filled 2015-08-25: qty 50

## 2015-08-25 MED ORDER — DIPHENHYDRAMINE HCL 12.5 MG/5ML PO ELIX
12.5000 mg | ORAL_SOLUTION | ORAL | Status: DC | PRN
  Administered 2015-08-25: 12.5 mg via ORAL
  Filled 2015-08-25: qty 10

## 2015-08-25 MED ORDER — LACTATED RINGERS IV SOLN: INTRAVENOUS | Status: DC

## 2015-08-25 MED ORDER — IMIPRAMINE HCL 25 MG PO TABS
25.0000 mg | ORAL_TABLET | Freq: Every day | ORAL | Status: DC
  Administered 2015-08-25 – 2015-08-27 (×3): 25 mg via ORAL
  Filled 2015-08-25 (×4): qty 1

## 2015-08-25 MED ORDER — CEFAZOLIN SODIUM-DEXTROSE 2-3 GM-% IV SOLR
INTRAVENOUS | Status: AC
  Filled 2015-08-25: qty 50

## 2015-08-25 MED ORDER — PANTOPRAZOLE SODIUM 40 MG PO TBEC
40.0000 mg | DELAYED_RELEASE_TABLET | Freq: Two times a day (BID) | ORAL | Status: DC
  Administered 2015-08-25 – 2015-08-28 (×6): 40 mg via ORAL
  Filled 2015-08-25 (×6): qty 1

## 2015-08-25 MED ORDER — MIDAZOLAM HCL 2 MG/2ML IJ SOLN
INTRAMUSCULAR | Status: DC | PRN
  Administered 2015-08-25: 2 mg via INTRAVENOUS

## 2015-08-25 MED ORDER — ALUM & MAG HYDROXIDE-SIMETH 200-200-20 MG/5ML PO SUSP: 30.0000 mL | ORAL | Status: DC | PRN

## 2015-08-25 MED ORDER — MORPHINE SULFATE (PF) 2 MG/ML IV SOLN
2.0000 mg | INTRAVENOUS | Status: DC | PRN
  Administered 2015-08-25: 2 mg via INTRAVENOUS
  Filled 2015-08-25: qty 1

## 2015-08-25 MED ORDER — BUPIVACAINE-EPINEPHRINE 0.25% -1:200000 IJ SOLN
INTRAMUSCULAR | Status: DC | PRN
  Administered 2015-08-25: 30 mL

## 2015-08-25 MED ORDER — TRAMADOL HCL 50 MG PO TABS
50.0000 mg | ORAL_TABLET | ORAL | Status: DC | PRN
  Administered 2015-08-25 (×2): 50 mg via ORAL
  Administered 2015-08-27: 100 mg via ORAL
  Filled 2015-08-25 (×2): qty 1
  Filled 2015-08-25: qty 2

## 2015-08-25 MED ORDER — ENOXAPARIN SODIUM 30 MG/0.3ML ~~LOC~~ SOLN
30.0000 mg | Freq: Two times a day (BID) | SUBCUTANEOUS | Status: DC
Start: 2015-08-26 — End: 2015-08-28
  Administered 2015-08-26 – 2015-08-28 (×5): 30 mg via SUBCUTANEOUS
  Filled 2015-08-25 (×5): qty 0.3

## 2015-08-25 MED ORDER — CEFAZOLIN SODIUM-DEXTROSE 2-3 GM-% IV SOLR
INTRAVENOUS | Status: DC | PRN
  Administered 2015-08-25: 2 g via INTRAVENOUS

## 2015-08-25 MED ORDER — BISACODYL 10 MG RE SUPP: 10.0000 mg | Freq: Every day | RECTAL | Status: DC | PRN

## 2015-08-25 MED ORDER — METOCLOPRAMIDE HCL 10 MG PO TABS
10.0000 mg | ORAL_TABLET | Freq: Three times a day (TID) | ORAL | Status: AC
  Administered 2015-08-25 – 2015-08-27 (×8): 10 mg via ORAL
  Filled 2015-08-25 (×8): qty 1

## 2015-08-25 MED ORDER — TRANEXAMIC ACID 1000 MG/10ML IV SOLN
1000.0000 mg | Freq: Once | INTRAVENOUS | Status: AC
  Administered 2015-08-25: 1000 mg via INTRAVENOUS
  Filled 2015-08-25 (×2): qty 10

## 2015-08-25 MED ORDER — ACETAMINOPHEN 650 MG RE SUPP: 650.0000 mg | Freq: Four times a day (QID) | RECTAL | Status: DC | PRN

## 2015-08-25 MED ORDER — ONDANSETRON HCL 4 MG PO TABS: 4.0000 mg | ORAL_TABLET | Freq: Four times a day (QID) | ORAL | Status: DC | PRN

## 2015-08-25 MED ORDER — ACETAMINOPHEN 10 MG/ML IV SOLN
1000.0000 mg | Freq: Four times a day (QID) | INTRAVENOUS | Status: AC
  Administered 2015-08-25 – 2015-08-26 (×3): 1000 mg via INTRAVENOUS
  Filled 2015-08-25 (×4): qty 100

## 2015-08-25 MED ORDER — SENNOSIDES-DOCUSATE SODIUM 8.6-50 MG PO TABS
1.0000 | ORAL_TABLET | Freq: Two times a day (BID) | ORAL | Status: DC
  Administered 2015-08-25 – 2015-08-28 (×6): 1 via ORAL
  Filled 2015-08-25 (×7): qty 1

## 2015-08-25 MED ORDER — ACETAMINOPHEN 10 MG/ML IV SOLN
INTRAVENOUS | Status: DC | PRN
  Administered 2015-08-25: 1000 mg via INTRAVENOUS

## 2015-08-25 MED ORDER — FLEET ENEMA 7-19 GM/118ML RE ENEM: 1.0000 | ENEMA | Freq: Once | RECTAL | Status: DC | PRN

## 2015-08-25 MED ORDER — FAMOTIDINE 20 MG PO TABS
20.0000 mg | ORAL_TABLET | Freq: Once | ORAL | Status: AC
  Administered 2015-08-25: 20 mg via ORAL

## 2015-08-25 MED ORDER — LACTATED RINGERS IV SOLN
INTRAVENOUS | Status: DC | PRN
  Administered 2015-08-25: 11:00:00 via INTRAVENOUS

## 2015-08-25 SURGICAL SUPPLY — 57 items
AUTOTRANSFUS HAS 1/8 (MISCELLANEOUS) ×3
BATTERY INSTRU NAVIGATION (MISCELLANEOUS) ×12 IMPLANT
BLADE SAW 1 (BLADE) ×3 IMPLANT
BLADE SAW 1/2 (BLADE) ×3 IMPLANT
CANISTER SUCT 1200ML W/VALVE (MISCELLANEOUS) ×3 IMPLANT
CANISTER SUCT 3000ML (MISCELLANEOUS) ×6 IMPLANT
CAP KNEE TOTAL 3 SIGMA ×3 IMPLANT
CATH TRAY METER 16FR LF (MISCELLANEOUS) ×3 IMPLANT
CEMENT HV SMART SET (Cement) ×6 IMPLANT
COOLER POLAR GLACIER W/PUMP (MISCELLANEOUS) ×3 IMPLANT
DECANTER SPIKE VIAL GLASS SM (MISCELLANEOUS) ×6 IMPLANT
DRAPE SHEET LG 3/4 BI-LAMINATE (DRAPES) ×3 IMPLANT
DRSG DERMACEA 8X12 NADH (GAUZE/BANDAGES/DRESSINGS) ×3 IMPLANT
DRSG OPSITE POSTOP 4X14 (GAUZE/BANDAGES/DRESSINGS) ×3 IMPLANT
DRSG TEGADERM 4X4.75 (GAUZE/BANDAGES/DRESSINGS) ×3 IMPLANT
DURAPREP 26ML APPLICATOR (WOUND CARE) ×6 IMPLANT
ELECT CAUTERY BLADE 6.4 (BLADE) ×3 IMPLANT
ELECT REM PT RETURN 9FT ADLT (ELECTROSURGICAL) ×3
ELECTRODE REM PT RTRN 9FT ADLT (ELECTROSURGICAL) ×1 IMPLANT
EX-PIN ORTHOLOCK NAV 4X150 (PIN) ×6 IMPLANT
GLOVE BIOGEL M STRL SZ7.5 (GLOVE) ×6 IMPLANT
GLOVE INDICATOR 8.0 STRL GRN (GLOVE) ×3 IMPLANT
GLOVE SURG 9.0 ORTHO LTXF (GLOVE) ×3 IMPLANT
GLOVE SURG ORTHO 9.0 STRL STRW (GLOVE) ×3 IMPLANT
GOWN STRL REUS W/ TWL LRG LVL3 (GOWN DISPOSABLE) ×2 IMPLANT
GOWN STRL REUS W/TWL 2XL LVL3 (GOWN DISPOSABLE) ×3 IMPLANT
GOWN STRL REUS W/TWL LRG LVL3 (GOWN DISPOSABLE) ×4
HANDPIECE SUCTION TUBG SURGILV (MISCELLANEOUS) ×3 IMPLANT
HOLDER FOLEY CATH W/STRAP (MISCELLANEOUS) ×3 IMPLANT
HOOD PEEL AWAY FLYTE STAYCOOL (MISCELLANEOUS) ×6 IMPLANT
KIT RM TURNOVER STRD PROC AR (KITS) ×3 IMPLANT
KNIFE SCULPS 14X20 (INSTRUMENTS) ×3 IMPLANT
NDL SAFETY 18GX1.5 (NEEDLE) ×3 IMPLANT
NEEDLE SPNL 20GX3.5 QUINCKE YW (NEEDLE) ×3 IMPLANT
NS IRRIG 500ML POUR BTL (IV SOLUTION) ×3 IMPLANT
PACK TOTAL KNEE (MISCELLANEOUS) ×3 IMPLANT
PAD WRAPON POLAR KNEE (MISCELLANEOUS) ×1 IMPLANT
PIN DRILL QUICK PACK ×3 IMPLANT
PIN FIXATION 1/8DIA X 3INL (PIN) ×3 IMPLANT
SOL .9 NS 3000ML IRR  AL (IV SOLUTION) ×2
SOL .9 NS 3000ML IRR UROMATIC (IV SOLUTION) ×1 IMPLANT
SOL PREP PVP 2OZ (MISCELLANEOUS) ×3
SOLUTION PREP PVP 2OZ (MISCELLANEOUS) ×1 IMPLANT
SPONGE DRAIN TRACH 4X4 STRL 2S (GAUZE/BANDAGES/DRESSINGS) ×3 IMPLANT
STAPLER SKIN PROX 35W (STAPLE) ×3 IMPLANT
SUCTION FRAZIER HANDLE 10FR (MISCELLANEOUS) ×2
SUCTION TUBE FRAZIER 10FR DISP (MISCELLANEOUS) ×1 IMPLANT
SUT VIC AB 0 CT1 36 (SUTURE) ×3 IMPLANT
SUT VIC AB 1 CT1 36 (SUTURE) ×6 IMPLANT
SUT VIC AB 2-0 CT2 27 (SUTURE) ×3 IMPLANT
SYR 20CC LL (SYRINGE) ×3 IMPLANT
SYR 30ML LL (SYRINGE) ×3 IMPLANT
SYR 50ML LL SCALE MARK (SYRINGE) ×3 IMPLANT
SYSTEM AUTOTRANSFUS DUAL TROCR (MISCELLANEOUS) ×1 IMPLANT
TOWEL OR 17X26 4PK STRL BLUE (TOWEL DISPOSABLE) ×3 IMPLANT
TOWER CARTRIDGE SMART MIX (DISPOSABLE) ×3 IMPLANT
WRAPON POLAR PAD KNEE (MISCELLANEOUS) ×3

## 2015-08-25 NOTE — Transfer of Care (Signed)
Immediate Anesthesia Transfer of Care Note  Patient: Molly Fisher  Procedure(s) Performed: Procedure(s): COMPUTER ASSISTED TOTAL KNEE ARTHROPLASTY (Left)  Patient Location: PACU  Anesthesia Type:Spinal  Level of Consciousness: awake and alert   Airway & Oxygen Therapy: Patient Spontanous Breathing  Post-op Assessment: Report given to RN  Post vital signs: Reviewed and stable  Last Vitals:  Filed Vitals:   08/25/15 1444 08/25/15 1445  BP:  109/60  Pulse:  73  Temp: 36.8 C 36.4 C  Resp:  11    Complications: No apparent anesthesia complications

## 2015-08-25 NOTE — H&P (Signed)
The patient has been re-examined, and the chart reviewed, and there have been no interval changes to the documented history and physical.    The risks, benefits, and alternatives have been discussed at length. The patient expressed understanding of the risks benefits and agreed with plans for surgical intervention.  James P. Hooten, Jr. M.D.    

## 2015-08-25 NOTE — Anesthesia Procedure Notes (Addendum)
Performed by: [id number], DAVID Oxygen Delivery Method: Simple face mask   Spinal  Start time: 08/25/2015 11:15 AM End time: 08/25/2015 11:20 AM Staffing Anesthesiologist: Alvin Critchley Performed by: anesthesiologist  Preanesthetic Checklist Completed: patient identified, site marked, surgical consent, pre-op evaluation, timeout performed, IV checked, risks and benefits discussed and monitors and equipment checked Spinal Block Patient position: sitting Prep: Betadine and site prepped and draped Patient monitoring: heart rate, cardiac monitor, continuous pulse ox and blood pressure Approach: right paramedian Location: L3-4 Needle Needle type: Whitacre  Needle gauge: 25 G Needle length: 9 cm Assessment Sensory level: T8 Additional Notes Time out called.  Patient placed in sitting position.  Prepped and draped in sterile fashion.  A skin wheal was made in the L3-L4 interspace with 1% Lidocaine plain.  A 25 G Whitacre was guided into the subarachnoid space with the return of clear, colorless CSF in all 4 quads..  No paresthesias or blood.  Patient was mobile , but tolerated the procedure well.

## 2015-08-25 NOTE — Brief Op Note (Signed)
08/25/2015  2:48 PM  PATIENT:  Molly Fisher  82 y.o. female  PRE-OPERATIVE DIAGNOSIS:  Degenerative arthrosis of the left knee  POST-OPERATIVE DIAGNOSIS:  Same  PROCEDURE:  Procedure(s): COMPUTER ASSISTED TOTAL KNEE ARTHROPLASTY (Left)  SURGEON:  Surgeon(s) and Role:    * Dereck Leep, MD - Primary  ASSISTANTS: Vance Peper, PA   ANESTHESIA:   spinal  EBL:  Total I/O In: 1700 [I.V.:1700] Out: 350 [Urine:300; Blood:50]  BLOOD ADMINISTERED:none  DRAINS: 2 medium drains to a reinfusion system   LOCAL MEDICATIONS USED:  MARCAINE    and OTHER Exparel  SPECIMEN:  No Specimen  DISPOSITION OF SPECIMEN:  N/A  COUNTS:  YES  TOURNIQUET:   97 minutes  DICTATION: .Dragon Dictation  PLAN OF CARE: Admit to inpatient   PATIENT DISPOSITION:  PACU - hemodynamically stable.   Delay start of Pharmacological VTE agent (>24hrs) due to surgical blood loss or risk of bleeding: yes

## 2015-08-25 NOTE — Anesthesia Preprocedure Evaluation (Addendum)
Anesthesia Evaluation  Patient identified by MRN, date of birth, ID band Patient awake    Reviewed: Allergy & Precautions, NPO status , Patient's Chart, lab work & pertinent test results  Airway Mallampati: II  TM Distance: >3 FB     Dental  (+) Chipped   Pulmonary neg pulmonary ROS,    Pulmonary exam normal breath sounds clear to auscultation       Cardiovascular negative cardio ROS Normal cardiovascular exam     Neuro/Psych Anxiety negative neurological ROS     GI/Hepatic Neg liver ROS, GERD  Medicated and Controlled,  Endo/Other  negative endocrine ROS  Renal/GU negative Renal ROS  negative genitourinary   Musculoskeletal  (+) Arthritis , Osteoarthritis,    Abdominal Normal abdominal exam  (+)   Peds negative pediatric ROS (+)  Hematology negative hematology ROS (+)   Anesthesia Other Findings   Reproductive/Obstetrics                            Anesthesia Physical Anesthesia Plan  ASA: II  Anesthesia Plan: Spinal   Post-op Pain Management:    Induction: Intravenous  Airway Management Planned:   Additional Equipment:   Intra-op Plan:   Post-operative Plan:   Informed Consent: I have reviewed the patients History and Physical, chart, labs and discussed the procedure including the risks, benefits and alternatives for the proposed anesthesia with the patient or authorized representative who has indicated his/her understanding and acceptance.   Dental advisory given  Plan Discussed with: CRNA and Surgeon  Anesthesia Plan Comments:         Anesthesia Quick Evaluation

## 2015-08-25 NOTE — Op Note (Signed)
OPERATIVE NOTE  DATE OF SURGERY:  08/25/2015  PATIENT NAME:  Molly Fisher   DOB: Nov 07, 1930  MRN: [id number]  PRE-OPERATIVE DIAGNOSIS: Degenerative arthrosis of the left knee, primary  POST-OPERATIVE DIAGNOSIS:  Same  PROCEDURE:  Left total knee arthroplasty using computer-assisted navigation  SURGEON:  Marciano Sequin. M.D.  ASSISTANT:  Vance Peper, PA (present and scrubbed throughout the case, critical for assistance with exposure, retraction, instrumentation, and closure)  ANESTHESIA: spinal  ESTIMATED BLOOD LOSS: 50 mL  FLUIDS REPLACED: 1700 mL of crystalloid  TOURNIQUET TIME: 97 minutes  DRAINS: 2 medium drains to a reinfusion system  SOFT TISSUE RELEASES: Anterior cruciate ligament, posterior cruciate ligament, deep medial collateral ligament, patellofemoral ligament, and posterolateral corner  IMPLANTS UTILIZED: DePuy PFC Sigma size 3 posterior stabilized femoral component (cemented), size 2.5 MBT tibial component (cemented), 35 mm 3 peg oval dome patella (cemented), and a 10 mm stabilized rotating platform polyethylene insert.  INDICATIONS FOR SURGERY: Molly Fisher is a 82 y.o. year old female with a long history of progressive knee pain. X-rays demonstrated severe degenerative changes in tricompartmental fashion. The patient had not seen any significant improvement despite conservative nonsurgical intervention. After discussion of the risks and benefits of surgical intervention, the patient expressed understanding of the risks benefits and agree with plans for total knee arthroplasty.   The risks, benefits, and alternatives were discussed at length including but not limited to the risks of infection, bleeding, nerve injury, stiffness, blood clots, the need for revision surgery, cardiopulmonary complications, among others, and they were willing to proceed.  PROCEDURE IN DETAIL: The patient was brought into the operating room and, after adequate spinal anesthesia was  achieved, a tourniquet was placed on the patient's upper thigh. The patient's knee and leg were cleaned and prepped with alcohol and DuraPrep and draped in the usual sterile fashion. A "timeout" was performed as per usual protocol. The lower extremity was exsanguinated using an Esmarch, and the tourniquet was inflated to 300 mmHg. An anterior longitudinal incision was made followed by a standard mid vastus approach. The deep fibers of the medial collateral ligament were elevated in a subperiosteal fashion off of the medial flare of the tibia so as to maintain a continuous soft tissue sleeve. The patella was subluxed laterally and the patellofemoral ligament was incised. Inspection of the knee demonstrated severe degenerative changes with full-thickness loss of articular cartilage. Osteophytes were debrided using a rongeur. Anterior and posterior cruciate ligaments were excised. Two 4.0 mm Schanz pins were inserted in the femur and into the tibia for attachment of the array of trackers used for computer-assisted navigation. Hip center was identified using a circumduction technique. Distal landmarks were mapped using the computer. The distal femur and proximal tibia were mapped using the computer. The distal femoral cutting guide was positioned using computer-assisted navigation so as to achieve a 5 distal valgus cut. The femur was sized and it was felt that a size 3 femoral component was appropriate. A size 3 femoral cutting guide was positioned and the anterior cut was performed and verified using the computer. This was followed by completion of the posterior and chamfer cuts. Femoral cutting guide for the central box was then positioned in the center box cut was performed.  Attention was then directed to the proximal tibia. Medial and lateral menisci were excised. The extramedullary tibial cutting guide was positioned using computer-assisted navigation so as to achieve a 0 varus-valgus alignment and 0  posterior slope. The cut was performed  and verified using the computer. The proximal tibia was sized and it was felt that a size 2.5 tibial tray was appropriate. Tibial and femoral trials were inserted followed by insertion of a 10 mm polyethylene insert. The nevus felt to be tight laterally. The trial components were removed and the knee placed in extension and distracted using the [place]land retractors. The posterolateral corner was carefully released using combination of electrocautery and Metzenbaum scissors. Trial components were reinserted and the knee placed through a range of motion. This allowed for excellent mediolateral soft tissue balancing both in flexion and in full extension. Finally, the patella was cut and prepared so as to accommodate a 35 mm 3 peg oval dome patella. A patella trial was placed and the knee was placed through a range of motion with excellent patellar tracking appreciated. The femoral trial was removed after debridement of posterior osteophytes. The central post-hole for the tibial component was reamed followed by insertion of a keel punch. Tibial trials were then removed. Cut surfaces of bone were irrigated with copious amounts of normal saline with antibiotic solution using pulsatile lavage and then suctioned dry. Polymethylmethacrylate cement was prepared in the usual fashion using a vacuum mixer. Cement was applied to the cut surface of the proximal tibia as well as along the undersurface of a size 2.5 MBT tibial component. Tibial component was positioned and impacted into place. Excess cement was removed using [employer] fast streamer. Cement was then applied to the cut surfaces of the femur as well as along the posterior flanges of the size 3 femoral component. The femoral component was positioned and impacted into place. Excess cement was removed using [employer] fast streamer. A 10 mm polyethylene trial was inserted and the knee was brought into full extension with steady axial compression  applied. Finally, cement was applied to the backside of a 35 mm 3 peg oval dome patella and the patellar component was positioned and patellar clamp applied. Excess cement was removed using [employer] fast streamer. After adequate curing of the cement, the tourniquet was deflated after a total tourniquet time of 97 minutes. Hemostasis was achieved using electrocautery. The knee was irrigated with copious amounts of normal saline with antibiotic solution using pulsatile lavage and then suctioned dry. 20 mL of 1.3% Exparel in 40 mL of normal saline was injected along the posterior capsule, medial and lateral gutters, and along the arthrotomy site. A 10 mm stabilized rotating platform polyethylene insert was inserted and the knee was placed through a range of motion with excellent mediolateral soft tissue balancing appreciated and excellent patellar tracking noted. 2 medium drains were placed in the wound bed and brought out through separate stab incisions to be attached to a reinfusion system. The medial parapatellar portion of the incision was reapproximated using interrupted sutures of #1 Vicryl. Subcutaneous tissue was then injected with a total of 30 cc of 0.25% Marcaine with epinephrine. Subcutaneous tissue was approximated in layers using first #0 Vicryl followed #2-0 Vicryl. The skin was approximated with skin staples. A sterile dressing was applied.  The patient tolerated the procedure well and was transported to the recovery room in stable condition.    [employer] P. Holley Bouche., M.D.

## 2015-08-25 NOTE — Progress Notes (Signed)
Pt dangled at bedside 

## 2015-08-26 ENCOUNTER — Encounter
Admission: RE | Admit: 2015-08-26 | Discharge: 2015-08-26 | Disposition: A | Discharge status: Home / Self Care | Payer: Medicare Other | Source: Ambulatory Visit | Attending: Internal Medicine | Admitting: Internal Medicine

## 2015-08-26 ENCOUNTER — Encounter: Payer: Self-pay | Admitting: Orthopedic Surgery

## 2015-08-26 LAB — BASIC METABOLIC PANEL
Anion gap: 3 — ABNORMAL LOW (ref 5–15)
BUN: 12 mg/dL (ref 6–20)
CHLORIDE: 107 mmol/L (ref 101–111)
CO2: 26 mmol/L (ref 22–32)
CREATININE: 0.81 mg/dL (ref 0.44–1.00)
Calcium: 7.9 mg/dL — ABNORMAL LOW (ref 8.9–10.3)
Glucose, Bld: 104 mg/dL — ABNORMAL HIGH (ref 65–99)
POTASSIUM: 4.1 mmol/L (ref 3.5–5.1)
SODIUM: 136 mmol/L (ref 135–145)

## 2015-08-26 LAB — CBC
HCT: 32.3 % — ABNORMAL LOW (ref 35.0–47.0)
HEMOGLOBIN: 10.9 g/dL — AB (ref 12.0–16.0)
MCH: 28.1 pg (ref 26.0–34.0)
MCHC: 33.7 g/dL (ref 32.0–36.0)
MCV: 83.3 fL (ref 80.0–100.0)
PLATELETS: 167 10*3/uL (ref 150–440)
RBC: 3.88 MIL/uL (ref 3.80–5.20)
RDW: 15 % — ABNORMAL HIGH (ref 11.5–14.5)
WBC: 5.6 10*3/uL (ref 3.6–11.0)

## 2015-08-26 MED ORDER — TRAMADOL HCL 50 MG PO TABS: 50.0000 mg | ORAL_TABLET | ORAL | Status: DC | PRN

## 2015-08-26 MED ORDER — ENOXAPARIN SODIUM 30 MG/0.3ML ~~LOC~~ SOLN: 30.0000 mg | Freq: Two times a day (BID) | SUBCUTANEOUS | Status: DC

## 2015-08-26 MED ORDER — OXYCODONE HCL 5 MG PO TABS: 5.0000 mg | ORAL_TABLET | ORAL | Status: DC | PRN

## 2015-08-26 NOTE — Care Management Note (Signed)
Case Management Note  Patient Details  Name: Molly Fisher MRN: [id number] Date of Birth: May 17, 1931  Subjective/Objective:                  Met with patient to discuss discharge planning. She wanted to go to [facility] and still considering paying for SNF privately. She has an elevated toilet and a rolling walker. She lives alone. She has support from family however they are not able to provide consistent care to patient (PT is not recommending 24/7 care). She uses [facility] for Rx : [phone] and is very anxious about giving Lovenox injections. She agrees to home health through [facility]. Please add HHRN for Lovenox teaching which will also be started here at [facility]. I spoke with patient's granddaughter [name] [phone] and patient's grandson was on speaker phone (he has colon cancer- new dx). Yolanda Bonine is going to call [facility] to try to work out a Midwife for patient go to SNF.  Action/Plan: List of home health agencies provided. Lovenox has not been called in until patient decides on where she is going. Referral made to [facility] [facility]. RNCM will continue to follow.   Expected Discharge Date:                  Expected Discharge Plan:     In-House Referral:     Discharge planning Services  CM Consult  Post Acute Care Choice:  Home Health Choice offered to:  Patient  DME Arranged:    DME Agency:     HH Arranged:  PT, RN [place] Agency:  [place]  Status of Service:  In process, will continue to follow  Medicare Important Message Given:    Date Medicare IM Given:    Medicare IM give by:    Date Additional Medicare IM Given:    Additional Medicare Important Message give by:     If discussed at [place] of Stay Meetings, dates discussed:    Additional Comments:  Marshell Garfinkel, RN 08/26/2015, 11:01 AM

## 2015-08-26 NOTE — Progress Notes (Signed)
Clinical Social Worker (CSW) received SNF consult. PT is recommending home health. RN Case Manager is aware of above. Please reconsult if future social work needs arise. CSW signing off.   Rally Ouch Morgan, LCSW [phone] 

## 2015-08-26 NOTE — Anesthesia Postprocedure Evaluation (Signed)
Anesthesia Post Note  Patient: Molly Fisher  Procedure(s) Performed: Procedure(s) (LRB): COMPUTER ASSISTED TOTAL KNEE ARTHROPLASTY (Left)  Patient location during evaluation: Nursing Unit Anesthesia Type: Spinal Level of consciousness: awake and awake and alert Pain management: pain level controlled Vital Signs Assessment: post-procedure vital signs reviewed and stable Respiratory status: spontaneous breathing and nonlabored ventilation Cardiovascular status: stable Postop Assessment: no headache and no backache Anesthetic complications: no    Last Vitals:  Filed Vitals:   08/26/15 0400 08/26/15 0725  BP: 130/75 139/55  Pulse: 55 64  Temp: 36.1 C 36.6 C  Resp: 12 17    Last Pain:  Filed Vitals:   08/26/15 0725  PainSc: 2                  Jolaine Fryberger,  Jahkai Yandell R

## 2015-08-26 NOTE — Clinical Social Work Placement (Signed)
   CLINICAL SOCIAL WORK PLACEMENT  NOTE  Date:  08/26/2015  Patient Details  Name: ADELE TILLISON MRN: [id number] Date of Birth: 1930/09/05  Clinical Social Work is seeking post-discharge placement for this patient at the [place] level of care (*CSW will initial, date and re-position this form in  chart as items are completed):  Yes   Patient/family provided with [place] Work Department's list of facilities offering this level of care within the geographic area requested by the patient (or if unable, by the patient's family).  Yes   Patient/family informed of their freedom to choose among providers that offer the needed level of care, that participate in Medicare, Medicaid or managed care program needed by the patient, have an available bed and are willing to accept the patient.  Yes   Patient/family informed of [facility]'s ownership interest in [facility] and [facility], as well as of the fact that they are under no obligation to receive care at these facilities.  PASRR submitted to EDS on 08/26/15     PASRR number received on 08/26/15     Existing PASRR number confirmed on       FL2 transmitted to all facilities in geographic area requested by pt/family on 08/26/15     FL2 transmitted to all facilities within larger geographic area on       Patient informed that his/her managed care company has contracts with or will negotiate with certain facilities, including the following:        Yes   Patient/family informed of bed offers received.  Patient chooses bed at  [facility] )     Physician recommends and patient chooses bed at      Patient to be transferred to   on  .  Patient to be transferred to facility by       Patient family notified on   of transfer.  Name of family member notified:        PHYSICIAN       Additional Comment:    _______________________________________________ Loralyn Freshwater, LCSW 08/26/2015,  2:51 PM

## 2015-08-26 NOTE — Evaluation (Signed)
Physical Therapy Evaluation Patient Details Name: Molly Fisher MRN: [id number] DOB: 1930/09/03 Today's Date: 08/26/2015   History of Present Illness  Pt underwent L TKR without reported post-op complications. PT evaluation performed on POD#1. One reported fall in the last 12 months  Clinical Impression  Pt demonstrates excellent bed mobility, transfers, and ambulation for first session POD#1. She does not require any manual assistance for transfers and ambulation. CGA only for safety but pt is very steady and stable with use of rolling walker. She is able to ambulate to RN station with relative ease and naturally progresses to step-through pattern. Pt denies pain at rest and only increases to 2/10 with ambulation. No DOE observed. Pt will be appropriate for discharge home with [facility] PT. She may require additional assist initially with IADLs from family. Pt is very anxious regarding discharge as she would prefer to go to SNF. PT does not believe that pt is appropriate for SNF placement at this time. Care manager notified of DC recommendations. Pt will benefit from skilled PT services to address deficits in strength, balance, and mobility in order to return to full function at home.     Follow Up Recommendations Home health PT;Other (comment) (Pt will need assist from family with IADLs)    Equipment Recommendations  None recommended by PT    Recommendations for Other Services       Precautions / Restrictions Precautions Precautions: Knee Precaution Booklet Issued: Yes (comment) Required Braces or Orthoses: Knee Immobilizer - Left Knee Immobilizer - Left: Discontinue once straight leg raise with < 10 degree lag Restrictions Weight Bearing Restrictions: Yes LLE Weight Bearing: Weight bearing as tolerated      Mobility  Bed Mobility Overal bed mobility: Needs Assistance Bed Mobility: Supine to Sit     Supine to sit: Min guard     General bed mobility comments: Pt provided verbal cues  for sequencing. HOB elevated and bed rail utilized but no physical assist required to come to sitting. Pt performs with relative ease and good speed  Transfers Overall transfer level: Needs assistance Equipment used: Rolling walker (2 wheeled) Transfers: Sit to/from Stand Sit to Stand: Min guard         General transfer comment: Pt demonstrates good speed and sequencing with sit to stand transfers. She does require repeated cues for safe hand placement during both phases of transfer. Performed at least 3 sit to stand transfers including transfer to [facility] for toileting  Ambulation/Gait Ambulation/Gait assistance: Min guard Ambulation Distance (Feet): 80 Feet Assistive device: Rolling walker (2 wheeled) Gait Pattern/deviations: Step-through pattern;Decreased stance time - left;Decreased weight shift to left Gait velocity: Decreased Gait velocity interpretation: <1.8 ft/sec, indicative of risk for recurrent falls General Gait Details: Pt demonstrates excellent gait stability with rolling walker. She naturally progress to step-through pattern. Pt denies DOE and pt reports increase in pain from 0/10 to 2/10 with ambulation. No manual assist required for stability or safety from therapist  Stairs            Wheelchair Mobility    Modified Rankin (Stroke Patients Only)       Balance Overall balance assessment: Needs assistance Sitting-balance support: No upper extremity supported Sitting balance-Leahy Scale: Good     Standing balance support: Bilateral upper extremity supported Standing balance-Leahy Scale: Fair Standing balance comment: Pt demonstrates good stability with UE support in standing. Full balance assessment deferred  Pertinent Vitals/Pain Pain Assessment: 0-10 Pain Score: 0-No pain Pain Location: Pt denies L knee pain at rest. Reports 2/10 L knee pain with ambulation Pain Intervention(s): Monitored during  session;Premedicated before session    [place] expects to be discharged to:: Skilled nursing facility Living Arrangements: Alone Available Help at Discharge: Family;Friend(s) Type of Home: House Home Access: Stairs to enter Entrance Stairs-Rails: Right Entrance Stairs-Number of Steps: 3 Home Layout: One level Home Equipment: Clinical cytogeneticist - 2 wheels;Cane - single point;Grab bars - tub/shower;Grab bars - toilet (no wheelchair, no hosptial bed, no BSC)      Prior Function Level of Independence: Independent with assistive device(s)         Comments: Intermittent use of spc vs rolling walker for limited community ambulation. Independent with ADLs/IADLs. Drives     Hand Dominance   Dominant Hand: Right    Extremity/Trunk Assessment   Upper Extremity Assessment: Overall WFL for tasks assessed;RUE deficits/detail RUE Deficits / Details: R shoulder flexion AROM approximatlely 80 degrees. 4-/5 R shoulder flexion. R elbow extension 4-/5. LUE appears grossly WFL at least 4 to 4+/5 throughout         Lower Extremity Assessment: LLE deficits/detail   LLE Deficits / Details: RLE grossly WFL. Full DF/PF LLE. Denies numbness/tingling. Sensation appears intact to light touch. Pt able to perform full SLR and SAQ without assistance LLE     Communication   Communication: No difficulties  Cognition Arousal/Alertness: Awake/alert Behavior During Therapy: WFL for tasks assessed/performed Overall Cognitive Status: Within Functional Limits for tasks assessed                      General Comments      Exercises Total Joint Exercises Ankle Circles/Pumps: Strengthening;Both;10 reps;Supine Quad Sets: Strengthening;Both;10 reps;Supine Gluteal Sets: Strengthening;Both;10 reps;Supine Towel Squeeze: Strengthening;Both;10 reps;Supine Short Arc Quad: Strengthening;10 reps;Supine;Left Heel Slides: Strengthening;Left;10 reps;Supine Hip ABduction/ADduction:  Strengthening;Left;10 reps;Supine Straight Leg Raises: Strengthening;Left;10 reps;Supine Goniometric ROM: -3-84 degrees AAROM, pain limited      Assessment/Plan    PT Assessment Patient needs continued PT services  PT Diagnosis Abnormality of gait;Difficulty walking;Generalized weakness;Acute pain   PT Problem List Decreased strength;Decreased range of motion;Decreased activity tolerance;Decreased balance;Decreased mobility;Decreased knowledge of use of DME;Pain  PT Treatment Interventions DME instruction;Gait training;Stair training;Therapeutic activities;Therapeutic exercise;Balance training;Neuromuscular re-education;Patient/family education;Manual techniques   PT Goals (Current goals can be found in the Care Plan section) Acute Rehab PT Goals Patient Stated Goal: Improve function at home PT Goal Formulation: With patient Time For Goal Achievement: 09/09/15 Potential to Achieve Goals: Good    Frequency BID   Barriers to discharge Decreased caregiver support Pt lives alone    Co-evaluation               End of Session Equipment Utilized During Treatment: Gait belt Activity Tolerance: Patient tolerated treatment well Patient left: in chair;with call bell/phone within reach;with chair alarm set;with SCD's reapplied (towel roll under heel, polar care in place) Nurse Communication: Other (comment) (Care manager notified of DC recommendations)         Time: 0919-1000 PT Time Calculation (min) (ACUTE ONLY): 41 min   Charges:   PT Evaluation $PT Eval Moderate Complexity: 1 Procedure PT Treatments $Therapeutic Exercise: 8-22 mins   PT G Codes:       Lyndel Safe Lamaj Metoyer PT, DPT   Pocahontas Cohenour 08/26/2015, 10:28 AM

## 2015-08-26 NOTE — Progress Notes (Signed)
   Subjective: 1 Day Post-Op Procedure(s) (LRB): COMPUTER ASSISTED TOTAL KNEE ARTHROPLASTY (Left) Patient reports pain as 3 on 0-10 scale.   Patient is well, and has had no acute complaints or problems We will start therapy today.  Plan is to go Rehab after hospital stay. no nausea and no vomiting Patient denies any chest pains or shortness of breath. Objective: Vital signs in last 24 hours: Temp:  [96.9 F (36.1 C)-98.3 F (36.8 C)] 97.9 F (36.6 C) (03/09 0725) Pulse Rate:  [51-106] 64 (03/09 0725) Resp:  [11-18] 17 (03/09 0725) BP: (109-168)/(47-106) 139/55 mmHg (03/09 0725) SpO2:  [93 %-100 %] 98 % (03/09 0725) Weight:  [82.6 kg (182 lb 1.6 oz)] 82.6 kg (182 lb 1.6 oz) (03/08 1631) Unable to address wound at this time 2/2 original dressing   Heels are non tender and elevated off the bed using rolled towels Intake/Output from previous day: 03/08 0701 - 03/09 0700 In: 2471.7 [I.V.:2471.7] Out: 2470 [Urine:2320; Drains:100; Blood:50] Intake/Output this shift:     Recent Labs  08/26/15 0656  HGB 10.9*    Recent Labs  08/26/15 0656  WBC 5.6  RBC 3.88  HCT 32.3*  PLT 167    Recent Labs  08/26/15 0656  NA 136  K 4.1  CL 107  CO2 26  BUN 12  CREATININE 0.81  GLUCOSE 104*  CALCIUM 7.9*    Recent Labs  08/25/15 1020  INR 1.33    EXAM General - Patient is Alert, Appropriate and Oriented Extremity - Neurologically intact Neurovascular intact Sensation intact distally Intact pulses distally Dorsiflexion/Plantar flexion intact Dressing - dressing C/D/I Motor Function - intact, moving foot and toes well on exam.    Past Medical History  Diagnosis Date  . Anxiety   . GERD (gastroesophageal reflux disease)   . Cancer (HCC)     squamous cell skin cancer  . Hypercholesteremia     Assessment/Plan: 1 Day Post-Op Procedure(s) (LRB): COMPUTER ASSISTED TOTAL KNEE ARTHROPLASTY (Left) Active Problems:   S/P total knee arthroplasty  Estimated body  mass index is 28.51 kg/(m^2) as calculated from the following:   Height as of this encounter: 5\' 7"  (1.702 m).   Weight as of this encounter: 82.6 kg (182 lb 1.6 oz). Advance diet Up with therapy D/C IV fluids Discharge to SNF  Labs: reviewed DVT Prophylaxis - Lovenox, Foot Pumps and TED hose Weight-Bearing as tolerated to left leg D/C O2 and Pulse OX and try on Room Air Begin working on a bowel movement Labs in am  [employer]. Prosser [place] 08/26/2015, 7:28 AM

## 2015-08-26 NOTE — Clinical Social Work Note (Signed)
Clinical Social Work Assessment  Patient Details  Name: Molly Fisher MRN: [id number] Date of Birth: 11/26/1930  Date of referral:  08/26/15               Reason for consult:  Facility Placement                Permission sought to share information with:  Chartered certified accountant granted to share information::  Yes, Verbal Permission Granted  Name::      IT sales professional::   [place]   Relationship::     Contact Information:     Housing/Transportation Living arrangements for the past 2 months:  [place] of Information:  Patient, Other (Comment Required) (Granddaughter Conservation officer, historic buildings ) Patient Interpreter Needed:  None Criminal Activity/Legal Involvement Pertinent to Current Situation/Hospitalization:  No - Comment as needed Significant Relationships:  Other Family Members Lives with:  Self Do you feel safe going back to the place where you live?  Yes Need for family participation in patient care:  Yes (Comment)  Care giving concerns:  Patient lives alone in [place].    Social Worker assessment / plan:  Holiday representative (CSW) received SNF consult. PT is recommending home health however patient wants to pay privately to go to [facility]. CSW met with patient alone at bedside. Patient was alert and oriented and sitting up in the bed. Patient reported that she lives alone and wants to go to [facility]. CSW explained that patient is doing well and PT is not recommending SNF. Patient is agreeable to pay privately. Per Molly Fisher admissions coordinator at [facility] patient can come to [facility] and not pay privately if she meets the 3 night stay. If patient is medically stable for D/C before the meets the 3 night stay Maudie Mercury reported that patient can come to [facility] and pay 7 days up front. CSW made patient aware of above. Patient asked CSW to contact her granddaughter Molly Fisher.   CSW contacted patient's granddaughter Molly Fisher [phone]. Molly Fisher also prefers for patient to go to [facility] under private pay if necessary. FL2 complete and faxed out. CSW will continue to follow and assist as needed.   Employment status:  Retired Forensic scientist:  Medicare PT Recommendations:  Home with [place] / Referral to community resources:  Other (Comment Required), [place] (Home Health VS. SNF )  Patient/Family's Response to care:  Patient prefers to go to [facility] under private pay if necessary.   Patient/Family's Understanding of and Emotional Response to Diagnosis, Current Treatment, and Prognosis: Patient was pleasant and thanked CSW for visit.   Emotional Assessment Appearance:  Appears stated age Attitude/Demeanor/Rapport:    Affect (typically observed):  Adaptable, Accepting, Pleasant Orientation:  Oriented to Self, Oriented to Place, Oriented to  Time, Oriented to Situation Alcohol / Substance use:  Not Applicable Psych involvement (Current and /or in the community):  No (Comment)  Discharge Needs  Concerns to be addressed:  Discharge Planning Concerns Readmission within the last 30 days:  No Current discharge risk:  None Barriers to Discharge:  No Barriers Identified   Loralyn Freshwater, LCSW 08/26/2015, 2:53 PM

## 2015-08-26 NOTE — NC FL2 (Signed)
[place] LEVEL OF CARE SCREENING TOOL     IDENTIFICATION  Patient Name: Molly Fisher Birthdate: 1931/01/26 Sex: female Admission Date (Current Location): 08/25/2015  [place] and [place] Number:  Engineering geologist and Address:  [facility], [address] [place], [place] [address]      Provider Number: [id number]  Attending Physician Name and Address:  Dereck Leep, MD  Relative Name and Phone Number:       Current Level of Care: Hospital Recommended Level of Care: [place] Prior Approval Number:    Date Approved/Denied:   PASRR Number: [id number]  Discharge Plan: SNF    Current Diagnoses: Patient Active Problem List   Diagnosis Date Noted  . S/P total knee arthroplasty 08/25/2015    Orientation RESPIRATION BLADDER Height & Weight     Self, Time, Situation, Place  Normal Continent Weight: 182 lb 1.6 oz (82.6 kg) Height:  5\' 7"  (170.2 cm)  BEHAVIORAL SYMPTOMS/MOOD NEUROLOGICAL BOWEL NUTRITION STATUS      Continent Diet (Regular Diet)  AMBULATORY STATUS COMMUNICATION OF NEEDS Skin   Limited Assist Verbally Normal                       Personal Care Assistance Level of Assistance  Bathing, Feeding, Dressing Bathing Assistance: Limited assistance Feeding assistance: Independent Dressing Assistance: Limited assistance     Functional Limitations Info  Sight, Hearing, Speech Sight Info: Adequate Hearing Info: Adequate Speech Info: Adequate    SPECIAL CARE FACTORS FREQUENCY  PT (By licensed PT), OT (By licensed OT)     PT Frequency: 5 OT Frequency: 5            Contractures      Additional Factors Info  Code Status, Allergies Code Status Info: Full Code Allergies Info: Statins           Current Medications (08/26/2015):  This is the current hospital active medication list Current Facility-Administered Medications  Medication Dose Route Frequency Provider Last Rate Last Dose   . 0.9 %  sodium chloride infusion   Intravenous Continuous Dereck Leep, MD 100 mL/hr at 08/25/15 2343    . acetaminophen (OFIRMEV) IV 1,000 mg  1,000 mg Intravenous 4 times per day Dereck Leep, MD   1,000 mg at 08/26/15 0616  . acetaminophen (TYLENOL) tablet 650 mg  650 mg Oral Q6H PRN Dereck Leep, MD       Or  . acetaminophen (TYLENOL) suppository 650 mg  650 mg Rectal Q6H PRN Dereck Leep, MD      . alum & mag hydroxide-simeth (MAALOX/MYLANTA) 200-200-20 MG/5ML suspension 30 mL  30 mL Oral Q4H PRN Dereck Leep, MD      . bisacodyl (DULCOLAX) suppository 10 mg  10 mg Rectal Daily PRN Dereck Leep, MD      . calcium carbonate (TUMS - dosed in mg elemental calcium) chewable tablet 300 mg of elemental calcium  1.5 tablet Oral Daily PRN Dereck Leep, MD      . ceFAZolin (ANCEF) IVPB 2 g/50 mL premix  2 g Intravenous Q6H Dereck Leep, MD   2 g at 08/26/15 0616  . celecoxib (CELEBREX) capsule 200 mg  200 mg Oral Q12H Dereck Leep, MD   200 mg at 08/26/15 0857  . diphenhydrAMINE (BENADRYL) 12.5 MG/5ML elixir 12.5-25 mg  12.5-25 mg Oral Q4H PRN Dereck Leep, MD   12.5 mg at 08/25/15 2353  .  enoxaparin (LOVENOX) injection 30 mg  30 mg Subcutaneous Q12H Dereck Leep, MD   30 mg at 08/26/15 0857  . ferrous sulfate tablet 325 mg  325 mg Oral BID WC Dereck Leep, MD   325 mg at 08/26/15 0857  . imipramine (TOFRANIL) tablet 25 mg  25 mg Oral QHS Dereck Leep, MD   25 mg at 08/25/15 2154  . LORazepam (ATIVAN) tablet 1 mg  1 mg Oral Q12H PRN Dereck Leep, MD   1 mg at 08/25/15 2139  . magnesium hydroxide (MILK OF MAGNESIA) suspension 30 mL  30 mL Oral Daily PRN Dereck Leep, MD   30 mL at 08/25/15 1809  . menthol-cetylpyridinium (CEPACOL) lozenge 3 mg  1 lozenge Oral PRN Dereck Leep, MD       Or  . phenol (CHLORASEPTIC) mouth spray 1 spray  1 spray Mouth/Throat PRN Dereck Leep, MD      . metoCLOPramide (REGLAN) tablet 10 mg  10 mg Oral TID AC & HS Dereck Leep, MD   10  mg at 08/26/15 0857  . morphine 2 MG/ML injection 2 mg  2 mg Intravenous Q2H PRN Dereck Leep, MD   2 mg at 08/25/15 1855  . ondansetron (ZOFRAN) tablet 4 mg  4 mg Oral Q6H PRN Dereck Leep, MD       Or  . ondansetron (ZOFRAN) injection 4 mg  4 mg Intravenous Q6H PRN Dereck Leep, MD      . oxyCODONE (Oxy IR/ROXICODONE) immediate release tablet 5-10 mg  5-10 mg Oral Q4H PRN Dereck Leep, MD   10 mg at 08/26/15 0857  . pantoprazole (PROTONIX) EC tablet 40 mg  40 mg Oral BID Dereck Leep, MD   40 mg at 08/26/15 0857  . senna-docusate (Senokot-S) tablet 1 tablet  1 tablet Oral BID Dereck Leep, MD   1 tablet at 08/26/15 0857  . sodium chloride (OCEAN) 0.65 % nasal spray 1 spray  1 spray Each Nare PRN Dereck Leep, MD      . sodium phosphate (FLEET) 7-19 GM/118ML enema 1 enema  1 enema Rectal Once PRN Dereck Leep, MD      . traMADol Veatrice Bourbon) tablet 50-100 mg  50-100 mg Oral Q4H PRN Dereck Leep, MD   50 mg at 08/25/15 2139  . vitamin C (ASCORBIC ACID) tablet 500 mg  500 mg Oral Daily Dereck Leep, MD   500 mg at 08/26/15 [id number]     Discharge Medications: Please see discharge summary for a list of discharge medications.  Relevant Imaging Results:  Relevant Lab Results:   Additional Information SSN:  [id number]  Darden Dates, LCSW

## 2015-08-26 NOTE — Progress Notes (Signed)
Physical Therapy Treatment Patient Details Name: Molly Fisher MRN: [id number] DOB: 1931/03/27 Today's Date: 08/26/2015    History of Present Illness Pt underwent L TKR without reported post-op complications. PT evaluation performed on POD#1. One reported fall in the last 12 months    PT Comments    Pt demonstrates excellent progress with physical therapy this afternoon. She is able to complete a full around RN station with good speed and stability. Gait speed is functional for full household mobility. Pt is modified independent with bed mobility and requires CGA only for transfers and ambulation. She is able to progress to reciprocal gait pattern. Pt able to complete all seated and supine exercises as instructed. Pt does not need SNF placement for PT needs. Will complete stairs with patient tomorrow AM at which point she will have met all PT barriers for discharge. Pt will benefit from skilled PT services to address deficits in strength, balance, and mobility in order to return to full function at home.    Follow Up Recommendations  Home health PT;Other (comment) (Pt will need assist from family with IADLs)     Equipment Recommendations  None recommended by PT    Recommendations for Other Services       Precautions / Restrictions Precautions Precautions: Knee Precaution Booklet Issued: Yes (comment) Required Braces or Orthoses: Knee Immobilizer - Left Knee Immobilizer - Left: Discontinue once straight leg raise with < 10 degree lag Restrictions Weight Bearing Restrictions: Yes LLE Weight Bearing: Weight bearing as tolerated    Mobility  Bed Mobility Overal bed mobility: Needs Assistance Bed Mobility: Supine to Sit     Supine to sit: Modified independent (Device/Increase time)     General bed mobility comments: Pt demonstrates excellent speed and sequecing.Does not require verbal cues or assist for bed mobility. HOB minimally elevated and very minimal use of bed  rails  Transfers Overall transfer level: Needs assistance Equipment used: Rolling walker (2 wheeled) Transfers: Sit to/from Stand Sit to Stand: Min guard         General transfer comment: Pt still requires cues for hand placement during transfers. Demonstrates good weight acceptance to LLE and good speed, sequencing,and stability  Ambulation/Gait Ambulation/Gait assistance: Min guard Ambulation Distance (Feet): 220 Feet Assistive device: Rolling walker (2 wheeled) Gait Pattern/deviations: Step-through pattern;Antalgic Gait velocity: Decreased, 10'=8.02seconds = 1.25 ft/sec Gait velocity interpretation: <1.8 ft/sec, indicative of risk for recurrent falls General Gait Details: Pt demonstrates excellent progression of gait to full reciprocal pattern. Cues for heel strike at iniital contact and push off at terminal stance. Vitals monitored and SaO2>90% on room air. Pt denies DOE with ambualtion. Slight decrease in stance time on LLE. Pt able to complete a full lap around Estate manager/land agent    Modified Rankin (Stroke Patients Only)       Balance Overall balance assessment: Needs assistance Sitting-balance support: No upper extremity supported Sitting balance-Leahy Scale: Good     Standing balance support: Bilateral upper extremity supported Standing balance-Leahy Scale: Fair                      Cognition Arousal/Alertness: Awake/alert Behavior During Therapy: WFL for tasks assessed/performed Overall Cognitive Status: Within Functional Limits for tasks assessed                      Exercises Total Joint Exercises Ankle Circles/Pumps: Strengthening;Both;10 reps;Supine (Heel raises  bilateral x 10 seated) Quad Sets: Strengthening;Both;10 reps;Supine Gluteal Sets: Strengthening;Both;10 reps;Supine Towel Squeeze: Strengthening;Both;10 reps;Supine Hip ABduction/ADduction: Strengthening;Left;10 reps;Supine (Performed  bilateral x 10 seated) Straight Leg Raises: Strengthening;Left;10 reps;Supine Long Arc Quad: Strengthening;Left;10 reps;Seated Knee Flexion: Strengthening;Left;10 reps;Seated    General Comments        Pertinent Vitals/Pain Pain Assessment: 0-10 Pain Score: 3  Pain Location: L knee, increases to 4/10 with ambulation. Once back in bed pt reports 0/10 pain Pain Descriptors / Indicators: Aching Pain Intervention(s): Monitored during session;Premedicated before session    Home Living                      Prior Function            PT Goals (current goals can now be found in the care plan section) Acute Rehab PT Goals Patient Stated Goal: Improve function at home PT Goal Formulation: With patient Time For Goal Achievement: 09/09/15 Potential to Achieve Goals: Good Progress towards PT goals: Progressing toward goals    Frequency  BID    PT Plan Current plan remains appropriate    Co-evaluation             End of Session Equipment Utilized During Treatment: Gait belt Activity Tolerance: Patient tolerated treatment well Patient left: with call bell/phone within reach;with SCD's reapplied;in bed;with bed alarm set (towel roll under heel, polar care in place)     Time: 2119-4174 PT Time Calculation (min) (ACUTE ONLY): 23 min  Charges:  $Gait Training: 8-22 mins $Therapeutic Exercise: 8-22 mins                    G Codes:      Lyndel Safe Alaysia Lightle PT, DPT   Kaija Kovacevic 08/26/2015, 3:01 PM

## 2015-08-27 LAB — CBC
HCT: 34.2 % — ABNORMAL LOW (ref 35.0–47.0)
HEMOGLOBIN: 11.4 g/dL — AB (ref 12.0–16.0)
MCH: 27.9 pg (ref 26.0–34.0)
MCHC: 33.3 g/dL (ref 32.0–36.0)
MCV: 83.8 fL (ref 80.0–100.0)
PLATELETS: 171 10*3/uL (ref 150–440)
RBC: 4.08 MIL/uL (ref 3.80–5.20)
RDW: 15.2 % — ABNORMAL HIGH (ref 11.5–14.5)
WBC: 6.3 10*3/uL (ref 3.6–11.0)

## 2015-08-27 NOTE — Progress Notes (Signed)
Physical Therapy Treatment Patient Details Name: Molly Fisher MRN: [id number] DOB: 08/13/1930 Today's Date: 08/27/2015    History of Present Illness 82 y/o female s/p R TKR 3/8.    PT Comments    Pt shows good effort with PT but is limited today due to fatigue.  She is safe with ambulation but is unable to walk nearly as far as yesterday reporting being very tired and somewhat stiff.  Pt with good ROM 1-91 and she generally does well with mobility/transfers.   Follow Up Recommendations   (per progress)     Equipment Recommendations  None recommended by PT    Recommendations for Other Services       Precautions / Restrictions Precautions Precautions: Fall Precaution Booklet Issued: Yes (comment) Required Braces or Orthoses: Knee Immobilizer - Left Knee Immobilizer - Left: Discontinue once straight leg raise with < 10 degree lag Restrictions Weight Bearing Restrictions: Yes LLE Weight Bearing: Weight bearing as tolerated    Mobility  Bed Mobility Overal bed mobility: Needs Assistance Bed Mobility: Supine to Sit     Supine to sit: Supervision        Transfers Overall transfer level: Modified independent Equipment used: Rolling walker (2 wheeled) Transfers: Sit to/from Stand Sit to Stand: Min guard         General transfer comment: Pt again needing cues for hand placement and sequencing during transfers  Ambulation/Gait Ambulation/Gait assistance: Min guard Ambulation Distance (Feet): 50 Feet Assistive device: Rolling walker (2 wheeled)       General Gait Details: Pt with some R WBing hesitancy and though she is not feeling as strong or stable as yesterday she is overall safe.  She is very tired with the effort today and reports that she cannot do much more.     Stairs            Wheelchair Mobility    Modified Rankin (Stroke Patients Only)       Balance                                    Cognition Arousal/Alertness:  Awake/alert Behavior During Therapy: WFL for tasks assessed/performed Overall Cognitive Status: Within Functional Limits for tasks assessed                      Exercises Total Joint Exercises Ankle Circles/Pumps: Strengthening;Both;10 reps;Supine Quad Sets: Strengthening;Both;10 reps;Supine Gluteal Sets: Strengthening;Both;10 reps;Supine Short Arc Quad: Strengthening;10 reps;Supine;Left Heel Slides: Strengthening;Left;10 reps;Supine Hip ABduction/ADduction: Strengthening;Left;10 reps;Supine Straight Leg Raises: Strengthening;Left;10 reps;Supine Knee Flexion: PROM;5 reps;Left Goniometric ROM: 1-91    General Comments        Pertinent Vitals/Pain Pain Assessment: 0-10 Pain Score: 3  (some increase with ROM acts) Pain Location: Left Knee 4/10 at rest, 9/10 with movement    Home Living Family/patient expects to be discharged to:: Skilled nursing facility Living Arrangements: Alone Available Help at Discharge: Family;Friend(s) Type of Home: House Home Access: Stairs to enter Entrance Stairs-Rails: Right Home Layout: One level Home Equipment: Clinical cytogeneticist - 2 wheels;Cane - single point;Grab bars - tub/shower;Grab bars - toilet      Prior Function Level of Independence: Independent with assistive device(s)      Comments: Pt. was able to drive. A friend assisted pt. with community  outings.   PT Goals (current goals can now be found in the care plan section) Acute Rehab PT Goals Patient Stated Goal:  To do more for herself, and be more independent Progress towards PT goals: Progressing toward goals    Frequency  BID    PT Plan Current plan remains appropriate    Co-evaluation             End of Session Equipment Utilized During Treatment: Gait belt Activity Tolerance: Patient tolerated treatment well Patient left: with call bell/phone within reach;with bed alarm set     Time: [id number] PT Time Calculation (min) (ACUTE ONLY): 24 min  Charges:   $Gait Training: 8-22 mins $Therapeutic Exercise: 8-22 mins                    G Codes:     Wayne Both, PT, DPT [phone]  Kreg Shropshire 08/27/2015, 11:50 AM

## 2015-08-27 NOTE — Evaluation (Signed)
Occupational Therapy Evaluation Patient Details Name: Molly Fisher MRN: [id number] DOB: May 08, 1931 Today's Date: 08/27/2015    History of Present Illness Pt. underwent a Left TKR.   Clinical Impression   Pt. Is an 82 y.o female who underwent a Left TKR. Pt. was progressively having more difficulty performing ADL and IADL tasks. Pt. Plans to go to SNF for STR. Pt. Continues to require skilled OT services for adaptive equipment training, and ADL/IADL retraining, as pt. plans to return to independent living.     Follow Up Recommendations  SNF    Equipment Recommendations  3 in 1 bedside comode    Recommendations for Other Services Rehab consult     Precautions / Restrictions Precautions Precautions: Knee Precaution Booklet Issued: Yes (comment) Required Braces or Orthoses: Knee Immobilizer - Left Knee Immobilizer - Left: Discontinue once straight leg raise with < 10 degree lag Restrictions Weight Bearing Restrictions: Yes LLE Weight Bearing: Weight bearing as tolerated      Mobility Bed Mobility                  Transfers Overall transfer level: Needs assistance   Transfers: Sit to/from Stand Sit to Stand: Min guard              Balance                                            ADL Overall ADL's : Needs assistance/impaired Eating/Feeding: Independent   Grooming: Set up               Lower Body Dressing: Moderate assistance     Toilet Transfer Details (indicate cue type and reason): Min guard sit to stand transfers           General ADL Comments: Pt. performed  A/E use for LE dressing. Pt. was provided with visual demonstration, and was able to return demonstration with visual and verbal cues.     Vision     Perception     Praxis      Pertinent Vitals/Pain Pain Assessment: 0-10 Pain Score: 4  Pain Location: Left Knee 4/10 at rest, 9/10 with movement     Hand Dominance Right   Extremity/Trunk  Assessment Upper Extremity Assessment RUE Deficits / Details: Pt. has a history of an old injury in right shoulder. RIght shoulder limitation in shoulder flexion, and abduction. Right shoulder strength 3-/5, elbow flexion/extension4-/5, Left UE strength WFL.           Communication Communication Communication: No difficulties   Cognition                           General Comments       Exercises       Shoulder Instructions      Home Living Family/patient expects to be discharged to:: Skilled nursing facility Living Arrangements: Alone Available Help at Discharge: Family;Friend(s) Type of Home: House Home Access: Stairs to enter [employer] of Steps: 3 Entrance Stairs-Rails: Right Home Layout: One level     Bathroom Shower/Tub: Tub/shower unit Shower/tub characteristics: Door Bathroom Toilet: Handicapped height     Home Equipment: Clinical cytogeneticist - 2 wheels;Cane - single point;Grab bars - tub/shower;Grab bars - toilet          Prior Functioning/Environment Level of Independence: Independent with assistive device(s)  Comments: Pt. was able to drive. A friend assisted pt. with community  outings.    OT Diagnosis: Generalized weakness   OT Problem List: Decreased strength;Decreased activity tolerance;Impaired UE functional use;Pain   OT Treatment/Interventions: Self-care/ADL training    OT Goals(Current goals can be found in the care plan section) Acute Rehab OT Goals Patient Stated Goal: To do more for herself, and be more independent OT Goal Formulation: With patient Time For Goal Achievement: 09/10/15 Potential to Achieve Goals: Good  OT Frequency: Min 2X/week   Barriers to D/C: Other (comment)  Pt. will be discharging to SNF Rehab       Co-evaluation              End of Session Equipment Utilized During Treatment: Gait belt;Rolling walker  Activity Tolerance: Patient tolerated treatment well Patient left:      Time: [id number] OT Time Calculation (min): 35 min Charges:  OT General Charges $OT Visit: 1 Procedure OT Evaluation $OT Eval Moderate Complexity: 1 Procedure G-Codes:    Harrel Carina, MS, OTR/L  Harrel Carina 08/27/2015, 10:16 AM

## 2015-08-27 NOTE — Progress Notes (Signed)
Plan is for patient to D/C to [facility] tomorrow 08/28/15 pending medical clearance. Per Amy admissions coordinator at [facility] patient will go to room 209-B. RN will call report at [phone]. Clinical Education officer, museum (CSW) sent D/C Summary, FL2, D/C Packet and signed [facility] consent form to Amy today. Patient is aware of above. CSW contacted patient's granddaughter Larene Beach [phone] and made her aware of above. Per Larene Beach she will provide transport for patient tomorrow. Patient will not have to pay privately for SNF. CSW will continue to follow and assist as needed.   Blima Rich, LCSW [phone]

## 2015-08-27 NOTE — Progress Notes (Signed)
Physical Therapy Treatment Patient Details Name: Molly Fisher MRN: [id number] DOB: May 02, 1931 Today's Date: 08/27/2015    History of Present Illness 82 y/o female s/p R TKR 3/8.    PT Comments    Pt continues to struggle relative to how well she did POD1.  She is able to ambulate ~100 ft but is relatively unsteady with consistent veering to the R and general inability to stay on task needing excessive cuing. She is unable to push herself and generally fatigue during session.   Follow Up Recommendations  SNF (per progress)     Equipment Recommendations  None recommended by PT    Recommendations for Other Services       Precautions / Restrictions Precautions Precautions: Fall Restrictions LLE Weight Bearing: Weight bearing as tolerated    Mobility  Bed Mobility Overal bed mobility: Modified Independent Bed Mobility: Sit to Supine       Sit to supine: Min guard   General bed mobility comments: Pt has some trouble lifting leg into bed, but is able to do so with great effort and no direct assist.  Transfers Overall transfer level: Modified independent Equipment used: Rolling walker (2 wheeled) Transfers: Sit to/from Stand Sit to Stand: Min guard         General transfer comment: Pt rises to standing well and w/o needing direct assist.  After walking she was very fatigued and did not use UEs properly or control descent showing poor awareness despite a lot of cuing.   Ambulation/Gait Ambulation/Gait assistance: Min guard Ambulation Distance (Feet): 100 Feet Assistive device: Rolling walker (2 wheeled)       General Gait Details: Pt able to increase ambulation distance from this AM, but is still tired and has some occasional unsteadiness.  She consistently veers to the R and has L knee buckling X 2.  She is unable to push herself showing considerable fatigue and generally appearing "off" with prolonged ambulation.    Stairs            Wheelchair  Mobility    Modified Rankin (Stroke Patients Only)       Balance                                    Cognition Arousal/Alertness: Awake/alert Behavior During Therapy: WFL for tasks assessed/performed Overall Cognitive Status: Within Functional Limits for tasks assessed                      Exercises Total Joint Exercises Ankle Circles/Pumps: Strengthening;Both;10 reps;Supine Quad Sets: Left;Strengthening;15 reps Gluteal Sets: Strengthening;Left;15 reps Short Arc Quad: Strengthening;10 reps;Supine;Left Heel Slides: Strengthening;Left;10 reps;Supine Hip ABduction/ADduction: Left;15 reps;Strengthening Straight Leg Raises: AROM;10 reps;Left Knee Flexion: PROM;5 reps;Left    General Comments        Pertinent Vitals/Pain Pain Score: 4     Home Living                      Prior Function            PT Goals (current goals can now be found in the care plan section) Progress towards PT goals: Progressing toward goals    Frequency  BID    PT Plan Current plan remains appropriate    Co-evaluation             End of Session Equipment Utilized During Treatment: Gait belt Activity Tolerance: Patient tolerated  treatment well Patient left: with call bell/phone within reach;with bed alarm set     Time: [id number] PT Time Calculation (min) (ACUTE ONLY): 26 min  Charges:  $Gait Training: 8-22 mins $Therapeutic Exercise: 8-22 mins                    G Codes:     Wayne Both, PT, DPT [phone]  Kreg Shropshire 08/27/2015, 3:28 PM

## 2015-08-27 NOTE — Progress Notes (Signed)
   Subjective: 2 Days Post-Op Procedure(s) (LRB): COMPUTER ASSISTED TOTAL KNEE ARTHROPLASTY (Left) Patient reports pain as mild.   Patient is well, and has had no acute complaints or problems Continue with physical therapy today.  Plan is to go Rehab after hospital stay. no nausea and no vomiting Patient denies any chest pains or shortness of breath. Objective: Vital signs in last 24 hours: Temp:  [97.8 F (36.6 C)-98.3 F (36.8 C)] 98.3 F (36.8 C) (03/10 0355) Pulse Rate:  [66-73] 66 (03/10 0355) Resp:  [18-20] 20 (03/10 0355) BP: (129-155)/(53-67) 155/62 mmHg (03/10 0355) SpO2:  [96 %-98 %] 98 % (03/10 0355) well approximated incision Heels are non tender and elevated off the bed using rolled towels Intake/Output from previous day: 03/09 0701 - 03/10 0700 In: 1465 [P.O.:840; I.V.:625] Out: 600 [Urine:600] Intake/Output this shift:     Recent Labs  08/26/15 0656 08/27/15 0541  HGB 10.9* 11.4*    Recent Labs  08/26/15 0656 08/27/15 0541  WBC 5.6 6.3  RBC 3.88 4.08  HCT 32.3* 34.2*  PLT 167 171    Recent Labs  08/26/15 0656  NA 136  K 4.1  CL 107  CO2 26  BUN 12  CREATININE 0.81  GLUCOSE 104*  CALCIUM 7.9*    Recent Labs  08/25/15 1020  INR 1.33    EXAM General - Patient is Alert, Appropriate and Oriented Extremity - Neurologically intact Neurovascular intact Sensation intact distally Intact pulses distally Dorsiflexion/Plantar flexion intact Dressing - moderate drainage Motor Function - intact, moving foot and toes well on exam.    Past Medical History  Diagnosis Date  . Anxiety   . GERD (gastroesophageal reflux disease)   . Cancer (HCC)     squamous cell skin cancer  . Hypercholesteremia     Assessment/Plan: 2 Days Post-Op Procedure(s) (LRB): COMPUTER ASSISTED TOTAL KNEE ARTHROPLASTY (Left) Active Problems:   S/P total knee arthroplasty  Estimated body mass index is 28.51 kg/(m^2) as calculated from the following:   Height  as of this encounter: 5\' 7"  (1.702 m).   Weight as of this encounter: 82.6 kg (182 lb 1.6 oz). Up with therapy Plan for discharge tomorrow Discharge to SNF  Labs: reviewed DVT Prophylaxis - Lovenox, Foot Pumps and TED hose Weight-Bearing as tolerated to left leg hemovac d/c'd Dressing changed    Jon R. [place] 08/27/2015, 7:55 AM

## 2015-08-27 NOTE — Discharge Instructions (Signed)

## 2015-08-27 NOTE — Discharge Summary (Signed)
Physician Discharge Summary  Patient ID: Molly Fisher MRN: [id number] DOB/AGE: [birth year] 80 y.o.  Admit date: 08/25/2015 Discharge date: 08/28/2015  Admission Diagnoses:  CHRONIC PAIN LEFT KNEE   Discharge Diagnoses: Patient Active Problem List   Diagnosis Date Noted  . S/P total knee arthroplasty 08/25/2015    Past Medical History  Diagnosis Date  . Anxiety   . GERD (gastroesophageal reflux disease)   . Cancer (HCC)     squamous cell skin cancer  . Hypercholesteremia      Transfusion: Autovac transfusions given the first 6 hours postoperatively   Consultants (if any):   case management for assistance with placement  Discharged Condition: Improved  Hospital Course: Molly Fisher is an 82 y.o. female who was admitted 08/25/2015 with a diagnosis of degenerative arthrosis left knee and went to the operating room on 08/25/2015 and underwent the above named procedures.    Surgeries:Procedure(s): COMPUTER ASSISTED TOTAL KNEE ARTHROPLASTY on 08/25/2015  PRE-OPERATIVE DIAGNOSIS: Degenerative arthrosis of the left knee, primary  POST-OPERATIVE DIAGNOSIS: Same  PROCEDURE: Left total knee arthroplasty using computer-assisted navigation  SURGEON: Marciano Sequin. M.D.  ASSISTANT: Vance Peper, PA (present and scrubbed throughout the case, critical for assistance with exposure, retraction, instrumentation, and closure)  ANESTHESIA: spinal  ESTIMATED BLOOD LOSS: 50 mL  FLUIDS REPLACED: 1700 mL of crystalloid  TOURNIQUET TIME: 97 minutes  DRAINS: 2 medium drains to a reinfusion system  SOFT TISSUE RELEASES: Anterior cruciate ligament, posterior cruciate ligament, deep medial collateral ligament, patellofemoral ligament, and posterolateral corner  IMPLANTS UTILIZED: DePuy PFC Sigma size 3 posterior stabilized femoral component (cemented), size 2.5 MBT tibial component (cemented), 35 mm 3 peg oval dome patella (cemented), and a 10 mm stabilized rotating platform  polyethylene insert.  INDICATIONS FOR SURGERY: Molly Fisher is a 82 y.o. year old female with a long history of progressive knee pain. X-rays demonstrated severe degenerative changes in tricompartmental fashion. The patient had not seen any significant improvement despite conservative nonsurgical intervention. After discussion of the risks and benefits of surgical intervention, the patient expressed understanding of the risks benefits and agree with plans for total knee arthroplasty.   The risks, benefits, and alternatives were discussed at length including but not limited to the risks of infection, bleeding, nerve injury, stiffness, blood clots, the need for revision surgery, cardiopulmonary complications, among others, and they were willing to proceed. Patient tolerated the surgery well. No complications .Patient was taken to PACU where she was stabilized and then transferred to the orthopedic floor.  Patient started on Lovenox 30 q 12 hrs. Foot pumps applied bilaterally at 80 mm hg. Heels elevated off bed with rolled towels. No evidence of DVT. Calves non tender. Negative Homan. Physical therapy started on day #1 for gait training and transfer with OT starting on  day #1 for ADL and assisted devices. Patient has done well with therapy. Ambulated greater than 200 feet upon being discharged.  Patient's IV and Foley were discontinued on day #1 with Hemovac being discontinued on day #2. Dressing also changed on day #2   She was given perioperative antibiotics:  Anti-infectives    Start     Dose/Rate Route Frequency Ordered Stop   08/25/15 1730  ceFAZolin (ANCEF) IVPB 2 g/50 mL premix     2 g 100 mL/hr over 30 Minutes Intravenous Every 6 hours 08/25/15 1621 08/26/15 1247   08/25/15 0930  ceFAZolin (ANCEF) IVPB 2 g/50 mL premix     2 g 100 mL/hr over 30  Minutes Intravenous  Once 08/25/15 0923 08/25/15 1000   08/25/15 0930  ceFAZolin (ANCEF) 2-3 GM-% IVPB SOLR    Comments:  Molly Fisher, Molly Fisher:  cabinet override      08/25/15 0930 08/25/15 2129    .  She was fitted with AV 1 compression foot pump devices bilaterally, instructed on heel pumps, early ambulation, and fitted with TED stockings bilaterally for DVT prophylaxis.  She benefited maximally from the hospital stay and there were no complications.    Recent vital signs:  Filed Vitals:   08/26/15 1953 08/27/15 0355  BP: 139/67 155/62  Pulse: 73 66  Temp: 97.8 F (36.6 C) 98.3 F (36.8 C)  Resp: 20 20    Recent laboratory studies:  Lab Results  Component Value Date   HGB 11.4* 08/27/2015   HGB 10.9* 08/26/2015   HGB 12.9 08/11/2015   Lab Results  Component Value Date   WBC 6.3 08/27/2015   PLT 171 08/27/2015   Lab Results  Component Value Date   INR 1.33 08/25/2015   Lab Results  Component Value Date   NA 136 08/26/2015   K 4.1 08/26/2015   CL 107 08/26/2015   CO2 26 08/26/2015   BUN 12 08/26/2015   CREATININE 0.81 08/26/2015   GLUCOSE 104* 08/26/2015    Discharge Medications:     Medication List    TAKE these medications        BIOTIN PO  Take 5,000 mcg by mouth daily.     calcium carbonate 750 MG chewable tablet  Commonly known as:  TUMS EX  Chew 1 tablet by mouth daily as needed for heartburn.     enoxaparin 30 MG/0.3ML injection  Commonly known as:  LOVENOX  Inject 0.3 mLs (30 mg total) into the skin every 12 (twelve) hours.     imipramine 25 MG tablet  Commonly known as:  TOFRANIL  Take 25 mg by mouth at bedtime.     LORazepam 1 MG tablet  Commonly known as:  ATIVAN  Take 1 mg by mouth every 12 (twelve) hours as needed for anxiety.     oxyCODONE 5 MG immediate release tablet  Commonly known as:  Oxy IR/ROXICODONE  Take 1-2 tablets (5-10 mg total) by mouth every 4 (four) hours as needed for severe pain or breakthrough pain.     sodium chloride 0.65 % Soln nasal spray  Commonly known as:  OCEAN  Place 1 spray into both nostrils as needed for congestion.     traMADol 50 MG  tablet  Commonly known as:  ULTRAM  Take 50-100 mg by mouth every 6 (six) hours as needed.     traMADol 50 MG tablet  Commonly known as:  ULTRAM  Take 1-2 tablets (50-100 mg total) by mouth every 4 (four) hours as needed for moderate pain.     VITAMIN C PO  Take 500 mg by mouth daily.        Diagnostic Studies: Dg Knee Left Port  08/25/2015  CLINICAL DATA:  Status post left knee replacement EXAM: PORTABLE LEFT KNEE - 1-2 VIEW COMPARISON:  None. FINDINGS: Left knee prosthesis is seen. A surgical drain is noted in place. No acute soft tissue abnormality is noted. IMPRESSION: Status post left knee replacement. Electronically Signed   By: Inez Catalina M.D.   On: 08/25/2015 15:19    Disposition: 01-Home or Self Care      Discharge Instructions    Diet - low sodium heart healthy  Complete by:  As directed      Diet - low sodium heart healthy    Complete by:  As directed      Discharge instructions    Complete by:  As directed   Change dressing as needed Remove staples 2 weeks post op Do not get the incision wet unable staples are removed Weight bear as tolerated Continue PT and OT     Increase activity slowly    Complete by:  As directed      Increase activity slowly    Complete by:  As directed            Follow-up Information    Follow up with Feliberto Gottron, PA-C On 09/09/2015.   Specialties:  Orthopedic Surgery, Emergency Medicine   Why:  AT 10:15AM   Contact information:   [place] [address] [phone]       Follow up with Dereck Leep, MD On 10/07/2015.   Specialty:  Orthopedic Surgery   Why:  AT 10:45AM   Contact information:   [address] [facility] [place] [address] [phone]       Follow up with [facility] .   Specialty:  [place] information:   [place] [phone]       Signed: Watt Climes. 08/27/2015, 7:59 AM

## 2015-08-27 NOTE — Patient Instructions (Signed)
Pt. Education was provided about adaptive equipment use for LE dressing.

## 2015-08-27 NOTE — Care Management Important Message (Signed)
Important Message  Patient Details  Name: Molly Fisher MRN: [id number] Date of Birth: 1931/01/28   Medicare Important Message Given:  Yes    [name] 08/27/2015, 10:07 AM

## 2015-08-28 MED ORDER — ENOXAPARIN SODIUM 30 MG/0.3ML ~~LOC~~ SOLN: 30.0000 mg | Freq: Two times a day (BID) | SUBCUTANEOUS | Status: DC

## 2015-08-28 NOTE — Progress Notes (Signed)
Patient is medically stable for D/C to [facility] today. Clinical Education officer, museum (CSW) contacted Therapist, sports on [employer] at [employer] this morning. Per RN patient can come today to room 209-B. RN will call report at [phone]. CSW sent D/C orders to [facility] on Friday 08/27/15. Patient's granddaughter Larene Beach will come pick patient up from the hospital today at 11 am. RN aware of above. Patient is aware of above. Please reconsult if future social work needs arise. CSW signing off.   Blima Rich, LCSW [phone]

## 2015-08-28 NOTE — Progress Notes (Signed)
Subjective: 3 Days Post-Op Procedure(s) (LRB): COMPUTER ASSISTED TOTAL KNEE ARTHROPLASTY (Left) Patient reports pain as mild.   Patient is well, and has had no acute complaints or problems Continue with physical therapy today.  Plan is to go Rehab after hospital stay. no nausea and no vomiting Patient denies any chest pains or shortness of breath. Objective: Vital signs in last 24 hours: Temp:  [97.6 F (36.4 C)-98.6 F (37 C)] 97.6 F (36.4 C) (03/11 0406) Pulse Rate:  [69-76] 70 (03/11 0406) Resp:  [18] 18 (03/11 0406) BP: (122-145)/(54-69) 140/63 mmHg (03/11 0406) SpO2:  [97 %-99 %] 97 % (03/11 0406) well approximated incision.  Mild bloody drainage along the distal aspect of the incision. Heels are non tender and elevated off the bed using rolled towels Intake/Output from previous day: 03/10 0701 - 03/11 0700 In: 480 [P.O.:480] Out: -  Intake/Output this shift:     Recent Labs  08/26/15 0656 08/27/15 0541  HGB 10.9* 11.4*    Recent Labs  08/26/15 0656 08/27/15 0541  WBC 5.6 6.3  RBC 3.88 4.08  HCT 32.3* 34.2*  PLT 167 171    Recent Labs  08/26/15 0656  NA 136  K 4.1  CL 107  CO2 26  BUN 12  CREATININE 0.81  GLUCOSE 104*  CALCIUM 7.9*    Recent Labs  08/25/15 1020  INR 1.33    EXAM General - Patient is Alert, Appropriate and Oriented Extremity - Neurologically intact Neurovascular intact Sensation intact distally Intact pulses distally Dorsiflexion/Plantar flexion intact Dressing - scant drainage Motor Function - intact, moving foot and toes well on exam.    Past Medical History  Diagnosis Date  . Anxiety   . GERD (gastroesophageal reflux disease)   . Cancer (HCC)     squamous cell skin cancer  . Hypercholesteremia     Assessment/Plan: 3 Days Post-Op Procedure(s) (LRB): COMPUTER ASSISTED TOTAL KNEE ARTHROPLASTY (Left) Active Problems:   S/P total knee arthroplasty  Estimated body mass index is 28.51 kg/(m^2) as calculated  from the following:   Height as of this encounter: 5\' 7"  (1.702 m).   Weight as of this encounter: 82.6 kg (182 lb 1.6 oz). Up with therapy Discharge to SNF  Labs: reviewed DVT Prophylaxis - Lovenox, Foot Pumps and TED hose Weight-Bearing as tolerated to left leg Plan for discharge to SNF today.   Pt will continue Lovenox x14 days following discharge for DVT prophylaxis.   Raquel James, PA-C [place] 08/28/2015, 7:11 AM

## 2015-08-28 NOTE — Progress Notes (Signed)
Report called to [place] at [facility]. Granddaughter coming around 11:00 to transport pt.

## 2015-08-28 NOTE — Progress Notes (Addendum)
Occupational Therapy Treatment Patient Details Name: Molly Fisher MRN: [id number] DOB: [birth year] Today's Date: 08/28/2015    History of present illness 82 y/o female s/p R TKR 3/8.   OT comments  Patient was sitting up in recliner when OT arrived. Motivated to participate in OT. Educated on use of adaptive equipment for lower body dressing. Patient able to return demonstrate with Min A/verbal cues for use of AE. Patient had questions about use of AE for compression stockings. Educated on different types of AE, and how they are used. Patient interested in trialing other types. Also aware that stockings are a little more challenging than the socks. Educated on safety during ADL tasks. Patient verbalized understanding.   Follow Up Recommendations  SNF    Equipment Recommendations  3 in 1 bedside comode    Recommendations for Other Services      Precautions / Restrictions Precautions Precautions: Fall Restrictions Weight Bearing Restrictions: Yes LLE Weight Bearing: Weight bearing as tolerated       Mobility Bed Mobility Overal bed mobility: Modified Independent Bed Mobility: Supine to Sit     Supine to sit: Supervision     General bed mobility comments: Pt shows good confidence getting up to EOB, minimal use of rails  Transfers Overall transfer level: Modified independent Equipment used: Rolling walker (2 wheeled) Transfers: Sit to/from Stand Sit to Stand: Supervision         General transfer comment: Pt able to rise w/o direct assist, maintains balance well and with minimal use of UEs    Balance                                   ADL Overall ADL's : Needs assistance/impaired                     Lower Body Dressing: Moderate assistance     Toilet Transfer Details (indicate cue type and reason): Min guard sit to stand transfers           General ADL Comments: Pt. performed  A/E use for LE dressing. Pt. was provided with visual  demonstration, and was able to return demonstration with visual and verbal cues.      Vision                     Perception     Praxis      Cognition   Behavior During Therapy: WFL for tasks assessed/performed Overall Cognitive Status: Within Functional Limits for tasks assessed                       Extremity/Trunk Assessment               Exercises    Shoulder Instructions       General Comments      Pertinent Vitals/ Pain       Pain Assessment: No/denies pain Pain Score: 0-No pain Pain Intervention(s): Monitored during session  Home Living                                          Prior Functioning/Environment              Frequency Min 2X/week     Progress Toward Goals  OT Goals(current goals can now be  found in the care plan section)  Progress towards OT goals: Progressing toward goals  Acute Rehab OT Goals Patient Stated Goal: To do more for herself, and be more independent OT Goal Formulation: With patient Time For Goal Achievement: 09/10/15 Potential to Achieve Goals: Good  Plan Discharge plan remains appropriate    Co-evaluation                 End of Session     Activity Tolerance Patient tolerated treatment well   Patient Left in chair;with call bell/phone within reach   Nurse Communication          Time: 0920-0950 OT Time Calculation (min): 30 min  Charges: OT General Charges $OT Visit: 1 Procedure OT Treatments $Self Care/Home Management : 23-37 mins  Sony Schlarb L 08/28/2015, 11:26 AM  Amie [place], OTR/L

## 2015-08-28 NOTE — Progress Notes (Signed)
Physical Therapy Treatment Patient Details Name: SHAMIEKA PRZYWARA MRN: [id number] DOB: [birth year] Today's Date: 08/28/2015    History of Present Illness 82 y/o female s/p R TKR 3/8.    PT Comments    Pt has a much better day with PT than yesterday and needs only minimal cuing for safety, posture and cadence during circumambulation around the nurses' station. She is able to get out of bed w/o direct physical assist and ultimately is doing well for POD3 expectations.    Follow Up Recommendations   (per progress)     Equipment Recommendations       Recommendations for Other Services       Precautions / Restrictions Precautions Precautions: Fall Restrictions Weight Bearing Restrictions: Yes LLE Weight Bearing: Weight bearing as tolerated    Mobility  Bed Mobility Overal bed mobility: Modified Independent Bed Mobility: Supine to Sit     Supine to sit: Supervision     General bed mobility comments: Pt shows good confidence getting up to EOB, minimal use of rails  Transfers Overall transfer level: Modified independent Equipment used: Rolling walker (2 wheeled) Transfers: Sit to/from Stand Sit to Stand: Supervision         General transfer comment: Pt able to rise w/o direct assist, maintains balance well and with minimal use of UEs  Ambulation/Gait Ambulation/Gait assistance: Supervision Ambulation Distance (Feet): 200 Feet Assistive device: Rolling walker (2 wheeled)       General Gait Details: Pt feeling much more confident with ambulation today vs yesterday and ultimately has no safety concerns or other issues.  She has very little veering and displays no LOBs, buckling or other issues.  She is fatigued after the effort, but generally does well for POD3.   Stairs            Wheelchair Mobility    Modified Rankin (Stroke Patients Only)       Balance                                    Cognition Arousal/Alertness:  Awake/alert Behavior During Therapy: WFL for tasks assessed/performed Overall Cognitive Status: Within Functional Limits for tasks assessed                      Exercises Total Joint Exercises Ankle Circles/Pumps: Strengthening;Both;10 reps;Supine Quad Sets: Left;Strengthening;15 reps Gluteal Sets: Strengthening;Left;15 reps Short Arc Quad: Strengthening;10 reps;Supine;Left Heel Slides: Strengthening;Left;10 reps;Supine Hip ABduction/ADduction: Left;15 reps;Strengthening Straight Leg Raises: AROM;10 reps;Left Knee Flexion: 10 reps;PROM;Left Goniometric ROM: full extension, greater than 90 flexion    General Comments        Pertinent Vitals/Pain Pain Score: 0-No pain (pt has moderate pain with ROM acts)    Home Living                      Prior Function            PT Goals (current goals can now be found in the care plan section) Progress towards PT goals: Progressing toward goals    Frequency  BID    PT Plan Current plan remains appropriate    Co-evaluation             End of Session Equipment Utilized During Treatment: Gait belt Activity Tolerance: Patient tolerated treatment well Patient left: with call bell/phone within reach;with bed alarm set     Time: [id number] PT Time Calculation (  min) (ACUTE ONLY): 25 min  Charges:  $Gait Training: 8-22 mins $Therapeutic Exercise: 8-22 mins                    G Codes:     Wayne Both, PT, DPT [phone]  Kreg Shropshire 08/28/2015, 10:43 AM

## 2015-08-28 NOTE — Progress Notes (Signed)
pts granddaughter at the bedside to transport pt, packet handed to granddaughter to give to [facility].

## 2015-08-28 NOTE — Clinical Social Work Placement (Signed)
   CLINICAL SOCIAL WORK PLACEMENT  NOTE  Date:  08/28/2015  Patient Details  Name: Molly Fisher MRN: [id number] Date of Birth: Jan 02, 1931  Clinical Social Work is seeking post-discharge placement for this patient at the [place] level of care (*CSW will initial, date and re-position this form in  chart as items are completed):  Yes   Patient/family provided with [facility] Work Department's list of facilities offering this level of care within the geographic area requested by the patient (or if unable, by the patient's family).  Yes   Patient/family informed of their freedom to choose among providers that offer the needed level of care, that participate in Medicare, Medicaid or managed care program needed by the patient, have an available bed and are willing to accept the patient.  Yes   Patient/family informed of [facility]'s ownership interest in [facility], as well as of the fact that they are under no obligation to receive care at these facilities.  PASRR submitted to EDS on 08/26/15     PASRR number received on 08/26/15     Existing PASRR number confirmed on       FL2 transmitted to all facilities in geographic area requested by pt/family on 08/26/15     FL2 transmitted to all facilities within larger geographic area on       Patient informed that his/her managed care company has contracts with or will negotiate with certain facilities, including the following:        Yes   Patient/family informed of bed offers received.  Patient chooses bed at  [facility] )     Physician recommends and patient chooses bed at      Patient to be transferred to  [facility] ) on 08/28/15.  Patient to be transferred to facility by  (Patient's granddaughter Larene Beach will transport patient today in private vehicle. )     Patient family notified on 08/28/15 of transfer.  Name of family member notified:   (Patient's  granddaughter Larene Beach is aware of D/C today. )     PHYSICIAN       Additional Comment:    _______________________________________________ Loralyn Freshwater, LCSW 08/28/2015, 8:49 AM

## 2015-09-07 LAB — C DIFFICILE QUICK SCREEN W PCR REFLEX
C DIFFICILE (CDIFF) INTERP: NEGATIVE
C DIFFICILE (CDIFF) TOXIN: NEGATIVE
C Diff antigen: NEGATIVE

## 2016-05-26 ENCOUNTER — Ambulatory Visit (INDEPENDENT_AMBULATORY_CARE_PROVIDER_SITE_OTHER): Payer: Medicare Other | Admitting: Vascular Surgery

## 2016-05-26 ENCOUNTER — Other Ambulatory Visit (INDEPENDENT_AMBULATORY_CARE_PROVIDER_SITE_OTHER): Payer: Self-pay | Admitting: Vascular Surgery

## 2016-05-26 ENCOUNTER — Ambulatory Visit
Admission: RE | Admit: 2016-05-26 | Discharge: 2016-05-26 | Disposition: A | Discharge status: Home / Self Care | Payer: Medicare Other | Source: Ambulatory Visit | Attending: Vascular Surgery | Admitting: Vascular Surgery

## 2016-05-26 ENCOUNTER — Encounter (INDEPENDENT_AMBULATORY_CARE_PROVIDER_SITE_OTHER): Payer: Medicare Other | Admitting: Vascular Surgery

## 2016-05-26 ENCOUNTER — Encounter (INDEPENDENT_AMBULATORY_CARE_PROVIDER_SITE_OTHER): Payer: Self-pay | Admitting: Vascular Surgery

## 2016-05-26 VITALS — BP 145/90 | HR 78 | Resp 16 | Ht 67.0 in | Wt 167.0 lb

## 2016-05-26 DIAGNOSIS — I513 Intracardiac thrombosis, not elsewhere classified: Secondary | ICD-10-CM | POA: Insufficient documentation

## 2016-05-26 DIAGNOSIS — M549 Dorsalgia, unspecified: Secondary | ICD-10-CM | POA: Insufficient documentation

## 2016-05-26 DIAGNOSIS — I714 Abdominal aortic aneurysm, without rupture, unspecified: Secondary | ICD-10-CM

## 2016-05-26 DIAGNOSIS — E785 Hyperlipidemia, unspecified: Secondary | ICD-10-CM | POA: Diagnosis not present

## 2016-05-26 DIAGNOSIS — Z9049 Acquired absence of other specified parts of digestive tract: Secondary | ICD-10-CM | POA: Insufficient documentation

## 2016-05-26 DIAGNOSIS — M5442 Lumbago with sciatica, left side: Secondary | ICD-10-CM

## 2016-05-26 DIAGNOSIS — G8929 Other chronic pain: Secondary | ICD-10-CM | POA: Diagnosis not present

## 2016-05-26 LAB — POCT I-STAT CREATININE: Creatinine, Ser: 0.9 mg/dL (ref 0.44–1.00)

## 2016-05-26 MED ORDER — IOPAMIDOL (ISOVUE-370) INJECTION 76%
100.0000 mL | Freq: Once | INTRAVENOUS | Status: AC | PRN
  Administered 2016-05-26: 100 mL via INTRAVENOUS

## 2016-05-26 NOTE — Assessment & Plan Note (Signed)
lipid control important in reducing the progression of atherosclerotic disease.   

## 2016-05-26 NOTE — Addendum Note (Signed)
Addended by: Algernon Huxley on: 05/26/2016 11:49 AM   Modules accepted: Orders

## 2016-05-26 NOTE — Assessment & Plan Note (Signed)
This was the reason for her MRI.  Significant spinal disease is present, and this is more likely the cause of her pain within the aneurysm.

## 2016-05-26 NOTE — Assessment & Plan Note (Addendum)
This is an addendum on the previous note from earlier today. The patient has undergone a CT angiogram of her abdomen and pelvis which I have independently reviewed. This demonstrates an approximately 6 cm infrarenal abdominal aortic aneurysm. Her right renal artery is significantly lower than the left, and may require stenting as part of an endovascular repair, but she does have anatomy that is amenable to endovascular repair. There is no evidence of extravasation or rupture. We had a long talk today about options for treatment. Given her anatomy, age, and other factors, endovascular repair of her abdominal aortic aneurysm would be planned. Risks and benefits of the procedure were discussed and she is agreeable to proceed.

## 2016-05-26 NOTE — Progress Notes (Signed)
Patient ID: Molly Fisher, female   DOB: 03-08-1931, 82 y.o.   MRN: [id number]  Chief Complaint  Patient presents with  . New Patient (Initial Visit)    HPI Molly Fisher is a 82 y.o. female.  I am asked to see the patient by Dr. Marry Guan for evaluation of AAA.  The patient reports chronic low back pain as well as sciatica down the left leg. For this reason, she underwent an MR angiogram which I have independently reviewed. She does not have any signs of peripheral embolization. She has some belching but no abdominal pain. She has no chest pain or shortness of breath. She has no fever or chills. Her MRI suggests a 5.7 cm infrarenal abdominal aortic aneurysm as well as significant spine disease.   Past Medical History:  Diagnosis Date  . Anxiety   . Cancer (HCC)    squamous cell skin cancer  . GERD (gastroesophageal reflux disease)   . Hypercholesteremia     Past Surgical History:  Procedure Laterality Date  . CATARACT EXTRACTION, BILATERAL    . COLONOSCOPY    . DILATION AND CURETTAGE OF UTERUS    . EYE SURGERY    . FRACTURE SURGERY Right    shoulder  . KNEE ARTHROPLASTY Left 08/25/2015   Procedure: COMPUTER ASSISTED TOTAL KNEE ARTHROPLASTY;  Surgeon: Dereck Leep, MD;  Location: [facility] ORS;  Service: Orthopedics;  Laterality: Left;    Family History  Problem Relation Age of Onset  . Aortic aneurysm Mother   . Cancer Father   No bleeding or clotting disorders.  Social History Social History  Substance Use Topics  . Smoking status: Never Smoker  . Smokeless tobacco: Never Used  . Alcohol use No  No IV drug use  Allergies  Allergen Reactions  . Statins Other (See Comments)    Cramping in her legs and uneasy feeling.    Current Outpatient Prescriptions  Medication Sig Dispense Refill  . Ascorbic Acid (VITAMIN C PO) Take 500 mg by mouth daily.    Marland Kitchen BIOTIN PO Take 5,000 mcg by mouth daily.    Marland Kitchen imipramine (TOFRANIL) 25 MG tablet Take 25 mg by mouth at bedtime.     Marland Kitchen LORazepam (ATIVAN) 1 MG tablet Take 1 mg by mouth every 12 (twelve) hours as needed for anxiety.    . traMADol (ULTRAM) 50 MG tablet Take 50-100 mg by mouth every 6 (six) hours as needed.    . calcium carbonate (TUMS EX) 750 MG chewable tablet Chew 1 tablet by mouth daily as needed for heartburn.    . enoxaparin (LOVENOX) 30 MG/0.3ML injection Inject 0.3 mLs (30 mg total) into the skin every 12 (twelve) hours. (Patient not taking: Reported on 05/26/2016) 28 Syringe 0  . oxyCODONE (OXY IR/ROXICODONE) 5 MG immediate release tablet Take 1-2 tablets (5-10 mg total) by mouth every 4 (four) hours as needed for severe pain or breakthrough pain. (Patient not taking: Reported on 05/26/2016) 30 tablet 0  . sodium chloride (OCEAN) 0.65 % SOLN nasal spray Place 1 spray into both nostrils as needed for congestion.    . traMADol (ULTRAM) 50 MG tablet Take 1-2 tablets (50-100 mg total) by mouth every 4 (four) hours as needed for moderate pain. (Patient not taking: Reported on 05/26/2016) 30 tablet 0   No current facility-administered medications for this visit.       REVIEW OF SYSTEMS (Negative unless checked)  Constitutional: [] Weight loss  [] Fever  [] Chills Cardiac: [] Chest pain   []   Chest pressure   [] Palpitations   [] Shortness of breath when laying flat   [] Shortness of breath at rest   [] Shortness of breath with exertion. Vascular:  [] Pain in legs with walking   [] Pain in legs at rest   [] Pain in legs when laying flat   [] Claudication   [] Pain in feet when walking  [] Pain in feet at rest  [] Pain in feet when laying flat   [] History of DVT   [] Phlebitis   [] Swelling in legs   [] Varicose veins   [] Non-healing ulcers Pulmonary:   [] Uses home oxygen   [] Productive cough   [] Hemoptysis   [] Wheeze  [] COPD   [] Asthma Neurologic:  [] Dizziness  [] Blackouts   [] Seizures   [] History of stroke   [] History of TIA  [] Aphasia   [] Temporary blindness   [] Dysphagia   [] Weakness or numbness in arms   [] Weakness or numbness  in legs Musculoskeletal:  [x] Arthritis   [] Joint swelling   [] Joint pain   [x] Low back pain Hematologic:  [] Easy bruising  [] Easy bleeding   [] Hypercoagulable state   [] Anemic  [] Hepatitis Gastrointestinal:  [] Blood in stool   [] Vomiting blood  [] Gastroesophageal reflux/heartburn   [] Abdominal pain Genitourinary:  [] Chronic kidney disease   [] Difficult urination  [] Frequent urination  [] Burning with urination   [] Hematuria Skin:  [] Rashes   [] Ulcers   [] Wounds Psychological:  [] History of anxiety   []  History of major depression.    Physical Exam BP (!) 145/90   Pulse 78   Resp 16   Ht 5\' 7"  (1.702 m)   Wt 167 lb (75.8 kg)   BMI 26.16 kg/m  Gen:  WD/WN, NAD Head: [place]/AT, No temporalis wasting. Prominent temp pulse not noted. Ear/Nose/Throat: Hearing grossly intact, nares w/o erythema or drainage, oropharynx w/o Erythema/Exudate Eyes: Conjunctiva clear, sclera non-icteric  Neck: trachea midline.  No bruit or JVD.  Pulmonary:  Good air movement, clear to auscultation bilaterally.  Cardiac: RRR, normal S1, S2, no Murmurs, rubs or gallops. Vascular:  Vessel Right Left  Radial Palpable Palpable  Ulnar Palpable Palpable  Brachial Palpable Palpable  Carotid Palpable, without bruit Palpable, without bruit  Aorta  palpable N/A  Femoral Palpable Palpable  Popliteal Palpable Palpable  PT Palpable Palpable  DP Palpable Palpable   Gastrointestinal: soft, non-tender/non-distended. No guarding/reflex. No masses, surgical incisions, or scars. Aortic impulse is enlarged Musculoskeletal: M/S 5/5 throughout.  Extremities without ischemic changes.  No deformity or atrophy.  Neurologic: Sensation grossly intact in extremities.  Symmetrical.  Speech is fluent. Motor exam as listed above. Psychiatric: Judgment intact, Mood & affect appropriate for pt's clinical situation. Dermatologic: No rashes or ulcers noted.  No cellulitis or open wounds. Lymph : No Cervical, Axillary, or Inguinal  lymphadenopathy.   Radiology No results found.  Labs No results found for this or any previous visit (from the past 2160 hour(s)).  Assessment/Plan:  Back pain This was the reason for her MRI.  Significant spinal disease is present, and this is more likely the cause of her pain within the aneurysm.  Hyperlipidemia lipid control important in reducing the progression of atherosclerotic disease.    AAA (abdominal aortic aneurysm) without rupture [facility]) The patient has an MRI which I have independently reviewed which suggested a 5.7 cm abdominal aortic aneurysm. This is an infrarenal aneurysm. MR is not very accurate for measuring aneurysms, but there is clearly an aneurysm present. I have ordered a CT angiogram for further evaluation of her aneurysm to determine the best choice of repair.  I have also ordered a CT angiogram to confirm the large size of the aneurysm. I have discussed the pathophysiology and natural history of the aneurysm with the patient and her family in some detail. I have discussed the reason and rationale for repair of aneurysms once they meet threshold size criteria which is usually 5 cm. We will obtain a CT angiogram and I will see her back in the very near future to discuss the results and determine further treatment options. We have discussed the hereditary nature and the fact that her first-degree relatives might need to be checked. We have also discussed continued abstinence from tobacco and blood pressure control being important.      Leotis Pain 05/26/2016, 11:19 AM   This note was created with Dragon medical transcription system.  Any errors from dictation are unintentional.

## 2016-05-26 NOTE — Progress Notes (Signed)
This is an addendum on the previous note from earlier today. The patient has undergone a CT angiogram of her abdomen and pelvis which I have independently reviewed. This demonstrates an approximately 6 cm infrarenal abdominal aortic aneurysm. Her right renal artery is significantly lower than the left, and may require stenting as part of an endovascular repair, but she does have anatomy that is amenable to endovascular repair. There is no evidence of extravasation or rupture. We had a long talk today about options for treatment. Given her anatomy, age, and other factors, endovascular repair of her abdominal aortic aneurysm would be planned. Risks and benefits of the procedure were discussed and she is agreeable to proceed.

## 2016-05-26 NOTE — Assessment & Plan Note (Signed)
The patient has an MRI which I have independently reviewed which suggested a 5.7 cm abdominal aortic aneurysm. This is an infrarenal aneurysm. MR is not very accurate for measuring aneurysms, but there is clearly an aneurysm present. I have ordered a CT angiogram for further evaluation of her aneurysm to determine the best choice of repair. I have also ordered a CT angiogram to confirm the large size of the aneurysm. I have discussed the pathophysiology and natural history of the aneurysm with the patient and her family in some detail. I have discussed the reason and rationale for repair of aneurysms once they meet threshold size criteria which is usually 5 cm. We will obtain a CT angiogram and I will see her back in the very near future to discuss the results and determine further treatment options. We have discussed the hereditary nature and the fact that her first-degree relatives might need to be checked. We have also discussed continued abstinence from tobacco and blood pressure control being important.

## 2016-05-26 NOTE — Patient Instructions (Signed)
Abdominal Aortic Aneurysm Endograft Repair Abdominal aortic aneurysm endograft repair is a surgery to fix an aortic aneurysm in the abdominal area. An aneurysm is a weak or damaged part of an artery wall that bulges out from the normal force of blood pumping through the body. An abdominal aortic aneurysm is an aneurysm that happens in the lower part of the aorta, which is the main artery of the body. The repair is often done if the aneurysm gets so large that it might burst (rupture). A ruptured aneurysm would cause bleeding inside the body that could put a person's life in danger. Before that happens, this procedure is needed to fix the problem. The procedure may also be done if the aneurysm causes symptoms such as pain in the back, abdomen, or side. In this procedure, a tube made of fabric and metal mesh (endograft or stent-graft) is placed in the weak part of the aorta to repair it. Tell a health care provider about:  Any allergies you have.  All medicines you are taking, including vitamins, herbs, eye drops, creams, and over-the-counter medicines.  Any problems you or family members have had with anesthetic medicines.  Any blood disorders you have.  Any surgeries you have had.  Any medical conditions you have.  Whether you are pregnant or may be pregnant. What are the risks? Generally, this is a safe procedure. However, problems may occur, including:  Infection of the graft or incision area.  Bleeding during the procedure or from the incision site.  Allergic reactions to medicines.  Damage to other structures or organs.  Blood leaking out around the endograft.  The endograft moving from where it was placed during surgery.  Blood flow through the graft becoming blocked.  Blood clots.  Kidney problems.  Blood flow to the legs becoming blocked (rare).  Rupture of the aorta even after the endograft repair is a success (rare). What happens before the procedure? Staying  hydrated  Follow instructions from your health care provider about hydration, which may include:  Up to 2 hours before the procedure - you may continue to drink clear liquids, such as water, clear fruit juice, black coffee, and plain tea. Eating and drinking restrictions  Follow instructions from your health care provider about eating and drinking, which may include:  8 hours before the procedure - stop eating heavy meals or foods such as meat, fried foods, or fatty foods.  6 hours before the procedure - stop eating light meals or foods, such as toast or cereal.  6 hours before the procedure - stop drinking milk or drinks that contain milk.  2 hours before the procedure - stop drinking clear liquids. Medicines  Ask your health care provider about:  Changing or stopping your regular medicines. This is especially important if you are taking diabetes medicines or blood thinners.  Taking medicines such as aspirin and ibuprofen. These medicines can thin your blood. Do not take these medicines before your procedure if your health care provider instructs you not to.  You may be given antibiotic medicine to help prevent infection. General instructions  You may need to have blood tests, a test to check heart rhythm (electrocardiogram, or ECG), or a test to check blood flow (angiogram) before the surgery.  Imaging tests will be done to check the size and location of the aneurysm. These tests could include an ultrasound, a CT scan, or an MRI.  Do not use any products that contain nicotine or tobacco-such as cigarettes and e-cigarettes-for   as long as possible before the surgery. If you need help quitting, ask your health care provider.  Ask your health care provider how your surgical site will be marked or identified.  Plan to have someone take you home from the hospital or clinic. What happens during the procedure?  To reduce your risk of infection:  Your health care team will wash or  sanitize their hands.  Your skin will be washed with soap.  Hair may be removed from the surgical area.  An IV tube will be inserted into one of your veins.  You will be given one or more of the following:  A medicine to help you relax (sedative).  A medicine to numb the area (local anesthetic).  A medicine to make you fall asleep (general anesthetic).  A medicine that is injected into an area of your body to numb everything below the injection site (regional anesthetic).  During the surgery:  Small incisions or a puncture will be made on one or both sides of the groin. Long, thin tubes (catheters) will be passed through the opening, put into the artery in your thigh, and moved up into the aneurysm in the aorta.  The health care provider will use live X-ray pictures to guide the endograft through the catheterto the place where the aneurysm is.  The endograft will be released to seal off the aneurysm and to line the aorta. It will keep blood from flowing into the aneurysm and will help keep it from rupturing. The endograft will stay in place and will not be taken out.  X-rays will be used to check where the endograft is placed and to make sure that it is where it should be.  The catheter will be taken out, and the incision will be closed with stitches (sutures). The procedure may vary among health care providers and hospitals. What happens after the procedure?  Your blood pressure, heart rate, breathing rate, and blood oxygen level will be monitored until the medicines you were given have worn off.  You will need to lie flat for a number of hours. Bending your legs can cause them to bleed and swell.  You will then be urged to get up and move around a number of times each day and to slowly become more active.  You will be given medicines to control pain.  Certain tests may be done after your procedure to check how well the endograft is working and to check its placement.  Do  not drive for 24 hours if you received a sedative. This information is not intended to replace advice given to you by your health care provider. Make sure you discuss any questions you have with your health care provider. Document Released: 10/22/2008 Document Revised: 12/24/2015 Document Reviewed: 08/30/2015 Elsevier Interactive Patient Education  2017 [employer]  

## 2016-05-26 NOTE — Patient Instructions (Signed)
Abdominal Aortic Aneurysm Blood pumps away from the heart through tubes (blood vessels) called arteries. Aneurysms are weak or damaged places in the wall of an artery. It bulges out like a balloon. An abdominal aortic aneurysm happens in the main artery of the body (aorta). It can burst or tear, causing bleeding inside the body. This is an emergency. It needs treatment right away. What are the causes? The exact cause is unknown. Things that could cause this problem include:  Fat and other substances building up in the lining of a tube.  Swelling of the walls of a blood vessel.  Certain tissue diseases.  Belly (abdominal) trauma.  An infection in the main artery of the body.  What increases the risk? There are things that make it more likely for you to have an aneurysm. These include:  Being over the age of 80 years old.  Having high blood pressure (hypertension).  Being a female.  Being white.  Being very overweight (obese).  Having a family history of aneurysm.  Using tobacco products.  What are the signs or symptoms? Symptoms depend on the size of the aneurysm and how fast it grows. There may not be symptoms. If symptoms occur, they can include:  Pain (belly, side, lower back, or groin).  Feeling full after eating a small amount of food.  Feeling sick to your stomach (nauseous), throwing up (vomiting), or both.  Feeling a lump in your belly that feels like it is beating (pulsating).  Feeling like you will pass out (faint).  How is this treated?  Medicine to control blood pressure and pain.  Imaging tests to see if the aneurysm gets bigger.  Surgery. How is this prevented? To lessen your chance of getting this condition:  Stop smoking. Stop chewing tobacco.  Limit or avoid alcohol.  Keep your blood pressure, blood sugar, and cholesterol within normal limits.  Eat less salt.  Eat foods low in saturated fats and cholesterol. These are found in animal and  whole dairy products.  Eat more fiber. Fiber is found in whole grains, vegetables, and fruits.  Keep a healthy weight.  Stay active and exercise often.  This information is not intended to replace advice given to you by your health care provider. Make sure you discuss any questions you have with your health care provider. Document Released: 09/30/2012 Document Revised: 11/11/2015 Document Reviewed: 07/05/2012 Elsevier Interactive Patient Education  2017 [employer] Inc.  

## 2016-05-29 ENCOUNTER — Telehealth (INDEPENDENT_AMBULATORY_CARE_PROVIDER_SITE_OTHER): Payer: Self-pay

## 2016-05-29 NOTE — Telephone Encounter (Signed)
Patients granddaughter Larene Beach called with questions concerning  The patients kidneys and AAA leaking. I left a message letting the Larene Beach know that per the CT the patient's kidneys are normal and that her AAA at this time is not leaking but in the office note Dr. Lucky Cowboy does state she has lower back pain and Sciatica down her left leg.

## 2016-06-01 ENCOUNTER — Telehealth (INDEPENDENT_AMBULATORY_CARE_PROVIDER_SITE_OTHER): Payer: Self-pay

## 2016-06-01 ENCOUNTER — Encounter (INDEPENDENT_AMBULATORY_CARE_PROVIDER_SITE_OTHER): Payer: Self-pay

## 2016-06-01 NOTE — Telephone Encounter (Signed)
Patient's granddaughter [name] [phone] )called wanting you to call her to discuss what to expect post-op for the patient, so she can put plans in place for her because she is independent.

## 2016-06-02 NOTE — Telephone Encounter (Signed)
Left a message

## 2016-06-05 ENCOUNTER — Other Ambulatory Visit (INDEPENDENT_AMBULATORY_CARE_PROVIDER_SITE_OTHER): Payer: Self-pay | Admitting: Vascular Surgery

## 2016-06-06 ENCOUNTER — Encounter
Admission: RE | Admit: 2016-06-06 | Discharge: 2016-06-06 | Disposition: A | Discharge status: Home / Self Care | Payer: Medicare Other | Source: Ambulatory Visit | Attending: Vascular Surgery | Admitting: Vascular Surgery

## 2016-06-06 DIAGNOSIS — E7800 Pure hypercholesterolemia, unspecified: Secondary | ICD-10-CM | POA: Insufficient documentation

## 2016-06-06 DIAGNOSIS — I714 Abdominal aortic aneurysm, without rupture: Secondary | ICD-10-CM | POA: Diagnosis not present

## 2016-06-06 DIAGNOSIS — Z01818 Encounter for other preprocedural examination: Secondary | ICD-10-CM | POA: Insufficient documentation

## 2016-06-06 HISTORY — DX: Low back pain: M54.5

## 2016-06-06 HISTORY — DX: Unspecified osteoarthritis, unspecified site: M19.90

## 2016-06-06 HISTORY — DX: Depression, unspecified: F32.A

## 2016-06-06 HISTORY — DX: Low back pain, unspecified: M54.50

## 2016-06-06 HISTORY — DX: Major depressive disorder, single episode, unspecified: F32.9

## 2016-06-06 LAB — BASIC METABOLIC PANEL
Anion gap: 8 (ref 5–15)
BUN: 11 mg/dL (ref 6–20)
CALCIUM: 9.9 mg/dL (ref 8.9–10.3)
CO2: 29 mmol/L (ref 22–32)
CREATININE: 0.86 mg/dL (ref 0.44–1.00)
Chloride: 103 mmol/L (ref 101–111)
GFR calc Af Amer: >60 mL/min (ref >60)
GLUCOSE: 111 mg/dL — AB (ref 65–99)
POTASSIUM: 3.9 mmol/L (ref 3.5–5.1)
Sodium: 140 mmol/L (ref 135–145)

## 2016-06-06 LAB — CBC WITH DIFFERENTIAL/PLATELET
BASOS ABS: 0 10*3/uL (ref 0–0.1)
Basophils Relative: 0 %
EOS PCT: 1 %
Eosinophils Absolute: 0.1 10*3/uL (ref 0–0.7)
HCT: 40.2 % (ref 35.0–47.0)
Hemoglobin: 13.4 g/dL (ref 12.0–16.0)
LYMPHS PCT: 15 %
Lymphs Abs: 0.9 10*3/uL — ABNORMAL LOW (ref 1.0–3.6)
MCH: 27.7 pg (ref 26.0–34.0)
MCHC: 33.3 g/dL (ref 32.0–36.0)
MCV: 83 fL (ref 80.0–100.0)
MONO ABS: 0.4 10*3/uL (ref 0.2–0.9)
Monocytes Relative: 7 %
Neutro Abs: 4.7 10*3/uL (ref 1.4–6.5)
Neutrophils Relative %: 77 %
PLATELETS: 238 10*3/uL (ref 150–440)
RBC: 4.84 MIL/uL (ref 3.80–5.20)
RDW: 15.2 % — AB (ref 11.5–14.5)
WBC: 6.2 10*3/uL (ref 3.6–11.0)

## 2016-06-06 LAB — APTT: APTT: 28 s (ref 24–36)

## 2016-06-06 LAB — SURGICAL PCR SCREEN
MRSA, PCR: NEGATIVE
STAPHYLOCOCCUS AUREUS: NEGATIVE

## 2016-06-06 LAB — PROTIME-INR
INR: 1.12
Prothrombin Time: 14.5 seconds (ref 11.4–15.2)

## 2016-06-06 NOTE — Patient Instructions (Signed)
  Your procedure is scheduled on: 06/14/16 Report to Day Surgery. [facility] second floor To find out your arrival time please call [phone] between 1PM - 3PM on 06/13/16.  Remember: Instructions that are not followed completely may result in serious medical risk, up to and including death, or upon the discretion of your surgeon and anesthesiologist your surgery may need to be rescheduled.    __x__ 1. Do not eat food or drink liquids after midnight. No gum chewing or hard candies.     ____ 2. No Alcohol for 24 hours before or after surgery.   ____ 3. Do Not Smoke For 24 Hours Prior to Your Surgery.   ____ 4. Bring all medications with you on the day of surgery if instructed.    __x__ 5. Notify your doctor if there is any change in your medical condition     (cold, fever, infections).       Do not wear jewelry, make-up, hairpins, clips or nail polish.  Do not wear lotions, powders, or perfumes. You may wear deodorant.  Do not shave 48 hours prior to surgery. Men may shave face and neck.  Do not bring valuables to the hospital.    [facility] is not responsible for any belongings or valuables.               Contacts, dentures or bridgework may not be worn into surgery.  Leave your suitcase in the car. After surgery it may be brought to your room.  For patients admitted to the hospital, discharge time is determined by your                treatment team.   Patients discharged the day of surgery will not be allowed to drive home.   Please read over the following fact sheets that you were given:   MRSA Information   _x___ Take these medicines the morning of surgery with A SIP OF WATER:    1.lorazepam  For nerves  2.   3.   4.  5.  6.  ____ Fleet Enema (as directed)   _x___ Use CHG Soap as directed  ____ Use inhalers on the day of surgery  ____ Stop metformin 2 days prior to surgery    ____ Take 1/2 of usual insulin dose the night before surgery and none on the  morning of surgery.   ____ Stop Coumadin/Plavix/aspirin on  _x___ Stop Anti-inflammatories on    NAPROXEN ALREADY STOPPED   _X___ Stop supplements until after surgery.      STOP VIT C 1000 MG UNTIL AFTER SURGERY   ____ Bring C-Pap to the hospital.

## 2016-06-13 MED ORDER — CEFAZOLIN SODIUM-DEXTROSE 2-4 GM/100ML-% IV SOLN
2.0000 g | INTRAVENOUS | Status: AC
  Administered 2016-06-14: 1 g via INTRAVENOUS
  Filled 2016-06-13: qty 100

## 2016-06-14 ENCOUNTER — Inpatient Hospital Stay: Payer: Medicare Other | Admitting: Anesthesiology

## 2016-06-14 ENCOUNTER — Encounter
Admission: RE | Disposition: A | Discharge status: Home Health | Payer: Self-pay | Source: Ambulatory Visit | Attending: Vascular Surgery

## 2016-06-14 ENCOUNTER — Inpatient Hospital Stay
Admission: RE | Admit: 2016-06-14 | Discharge: 2016-06-16 | DRG: 269 | Disposition: A | Discharge status: Home Health | Payer: Medicare Other | Source: Ambulatory Visit | Attending: Vascular Surgery | Admitting: Vascular Surgery

## 2016-06-14 DIAGNOSIS — I739 Peripheral vascular disease, unspecified: Secondary | ICD-10-CM | POA: Diagnosis present

## 2016-06-14 DIAGNOSIS — F329 Major depressive disorder, single episode, unspecified: Secondary | ICD-10-CM | POA: Diagnosis present

## 2016-06-14 DIAGNOSIS — F419 Anxiety disorder, unspecified: Secondary | ICD-10-CM | POA: Diagnosis present

## 2016-06-14 DIAGNOSIS — Z85828 Personal history of other malignant neoplasm of skin: Secondary | ICD-10-CM

## 2016-06-14 DIAGNOSIS — K219 Gastro-esophageal reflux disease without esophagitis: Secondary | ICD-10-CM | POA: Diagnosis present

## 2016-06-14 DIAGNOSIS — I714 Abdominal aortic aneurysm, without rupture, unspecified: Secondary | ICD-10-CM | POA: Diagnosis present

## 2016-06-14 DIAGNOSIS — I701 Atherosclerosis of renal artery: Secondary | ICD-10-CM | POA: Diagnosis not present

## 2016-06-14 HISTORY — PX: PERIPHERAL VASCULAR CATHETERIZATION: SHX172C

## 2016-06-14 LAB — PREPARE RBC (CROSSMATCH)

## 2016-06-14 SURGERY — ENDOVASCULAR REPAIR/STENT GRAFT
Anesthesia: General

## 2016-06-14 MED ORDER — LIDOCAINE HCL (CARDIAC) 20 MG/ML IV SOLN
INTRAVENOUS | Status: DC | PRN
  Administered 2016-06-14: 30 mg via INTRAVENOUS

## 2016-06-14 MED ORDER — PHENOL 1.4 % MT LIQD
1.0000 | OROMUCOSAL | Status: DC | PRN
  Filled 2016-06-14: qty 177

## 2016-06-14 MED ORDER — ACETAMINOPHEN 650 MG RE SUPP: 325.0000 mg | RECTAL | Status: DC | PRN

## 2016-06-14 MED ORDER — LACTATED RINGERS IV SOLN
INTRAVENOUS | Status: DC
  Administered 2016-06-14: 12:00:00 via INTRAVENOUS

## 2016-06-14 MED ORDER — SODIUM CHLORIDE 0.9 % IV SOLN: 500.0000 mL | Freq: Once | INTRAVENOUS | Status: DC | PRN

## 2016-06-14 MED ORDER — FAMOTIDINE 20 MG PO TABS: 20.0000 mg | ORAL_TABLET | Freq: Once | ORAL | Status: DC

## 2016-06-14 MED ORDER — ROCURONIUM BROMIDE 100 MG/10ML IV SOLN
INTRAVENOUS | Status: DC | PRN
  Administered 2016-06-14: 30 mg via INTRAVENOUS
  Administered 2016-06-14: 40 mg via INTRAVENOUS

## 2016-06-14 MED ORDER — DEXAMETHASONE SODIUM PHOSPHATE 10 MG/ML IJ SOLN
INTRAMUSCULAR | Status: DC | PRN
  Administered 2016-06-14: 10 mg via INTRAVENOUS

## 2016-06-14 MED ORDER — MORPHINE SULFATE (PF) 4 MG/ML IV SOLN
2.0000 mg | INTRAVENOUS | Status: DC | PRN
  Administered 2016-06-14: 4 mg via INTRAVENOUS
  Filled 2016-06-14: qty 1

## 2016-06-14 MED ORDER — LABETALOL HCL 5 MG/ML IV SOLN
10.0000 mg | INTRAVENOUS | Status: DC | PRN
  Administered 2016-06-14: 10 mg via INTRAVENOUS

## 2016-06-14 MED ORDER — CEFAZOLIN IN D5W 1 GM/50ML IV SOLN
1.0000 g | Freq: Three times a day (TID) | INTRAVENOUS | Status: AC
  Administered 2016-06-14 – 2016-06-15 (×3): 1 g via INTRAVENOUS
  Filled 2016-06-14 (×3): qty 50

## 2016-06-14 MED ORDER — MANNITOL 25 % IV SOLN
INTRAVENOUS | Status: AC
  Filled 2016-06-14: qty 50

## 2016-06-14 MED ORDER — OXYCODONE-ACETAMINOPHEN 5-325 MG PO TABS
1.0000 | ORAL_TABLET | ORAL | Status: DC | PRN
  Administered 2016-06-14: 1 via ORAL
  Filled 2016-06-14: qty 1

## 2016-06-14 MED ORDER — METOPROLOL TARTRATE 5 MG/5ML IV SOLN: 2.0000 mg | INTRAVENOUS | Status: DC | PRN

## 2016-06-14 MED ORDER — ALUM & MAG HYDROXIDE-SIMETH 200-200-20 MG/5ML PO SUSP: 15.0000 mL | ORAL | Status: DC | PRN

## 2016-06-14 MED ORDER — CHLORHEXIDINE GLUCONATE CLOTH 2 % EX PADS: 6.0000 | MEDICATED_PAD | Freq: Once | CUTANEOUS | Status: DC

## 2016-06-14 MED ORDER — FENTANYL CITRATE (PF) 100 MCG/2ML IJ SOLN: 25.0000 ug | INTRAMUSCULAR | Status: DC | PRN

## 2016-06-14 MED ORDER — DOPAMINE-DEXTROSE 3.2-5 MG/ML-% IV SOLN
3.0000 ug/kg/min | INTRAVENOUS | Status: DC
  Administered 2016-06-14: 3 ug/kg/min via INTRAVENOUS
  Filled 2016-06-14: qty 250

## 2016-06-14 MED ORDER — MAGNESIUM SULFATE 2 GM/50ML IV SOLN
2.0000 g | Freq: Every day | INTRAVENOUS | Status: DC | PRN
  Filled 2016-06-14: qty 50

## 2016-06-14 MED ORDER — SORBITOL 70 % SOLN
30.0000 mL | Freq: Every day | Status: DC | PRN
  Filled 2016-06-14 (×2): qty 30

## 2016-06-14 MED ORDER — HEPARIN SODIUM (PORCINE) 1000 UNIT/ML IJ SOLN
INTRAMUSCULAR | Status: DC | PRN
  Administered 2016-06-14: 7000 [IU] via INTRAVENOUS
  Administered 2016-06-14: 1000 [IU] via INTRAVENOUS

## 2016-06-14 MED ORDER — ONDANSETRON HCL 4 MG/2ML IJ SOLN
INTRAMUSCULAR | Status: DC | PRN
  Administered 2016-06-14: 4 mg via INTRAVENOUS

## 2016-06-14 MED ORDER — NITROGLYCERIN IN D5W 200-5 MCG/ML-% IV SOLN: 5.0000 ug/min | INTRAVENOUS | Status: DC

## 2016-06-14 MED ORDER — DOCUSATE SODIUM 100 MG PO CAPS
100.0000 mg | ORAL_CAPSULE | Freq: Every day | ORAL | Status: DC
Start: 2016-06-15 — End: 2016-06-16
  Filled 2016-06-14 (×2): qty 1

## 2016-06-14 MED ORDER — SUGAMMADEX SODIUM 200 MG/2ML IV SOLN
INTRAVENOUS | Status: DC | PRN
  Administered 2016-06-14: 150 mg via INTRAVENOUS

## 2016-06-14 MED ORDER — ACETAMINOPHEN 325 MG PO TABS: 325.0000 mg | ORAL_TABLET | ORAL | Status: DC | PRN

## 2016-06-14 MED ORDER — FAMOTIDINE IN NACL 20-0.9 MG/50ML-% IV SOLN
20.0000 mg | Freq: Two times a day (BID) | INTRAVENOUS | Status: DC
  Administered 2016-06-14: 20 mg via INTRAVENOUS
  Filled 2016-06-14: qty 50

## 2016-06-14 MED ORDER — ONDANSETRON HCL 4 MG/2ML IJ SOLN: 4.0000 mg | Freq: Once | INTRAMUSCULAR | Status: DC | PRN

## 2016-06-14 MED ORDER — MAGNESIUM CITRATE PO SOLN
1.0000 | Freq: Once | ORAL | Status: DC | PRN
  Filled 2016-06-14: qty 296

## 2016-06-14 MED ORDER — POTASSIUM CHLORIDE CRYS ER 20 MEQ PO TBCR: 20.0000 meq | EXTENDED_RELEASE_TABLET | Freq: Every day | ORAL | Status: DC | PRN

## 2016-06-14 MED ORDER — FENTANYL CITRATE (PF) 100 MCG/2ML IJ SOLN
INTRAMUSCULAR | Status: DC | PRN
  Administered 2016-06-14: 50 ug via INTRAVENOUS
  Administered 2016-06-14: 100 ug via INTRAVENOUS
  Administered 2016-06-14: 50 ug via INTRAVENOUS

## 2016-06-14 MED ORDER — SODIUM CHLORIDE 0.9 % IV SOLN
INTRAVENOUS | Status: DC
  Administered 2016-06-14 – 2016-06-15 (×3): via INTRAVENOUS

## 2016-06-14 MED ORDER — POLYETHYLENE GLYCOL 3350 17 G PO PACK: 17.0000 g | PACK | Freq: Every day | ORAL | Status: DC | PRN

## 2016-06-14 MED ORDER — GUAIFENESIN-DM 100-10 MG/5ML PO SYRP: 15.0000 mL | ORAL_SOLUTION | ORAL | Status: DC | PRN

## 2016-06-14 MED ORDER — EPHEDRINE SULFATE 50 MG/ML IJ SOLN
INTRAMUSCULAR | Status: DC | PRN
  Administered 2016-06-14: 15 mg via INTRAVENOUS
  Administered 2016-06-14: 10 mg via INTRAVENOUS

## 2016-06-14 MED ORDER — MANNITOL 25 % IV SOLN
INTRAVENOUS | Status: DC | PRN
  Administered 2016-06-14: 12.5 g via INTRAVENOUS

## 2016-06-14 MED ORDER — CLOPIDOGREL BISULFATE 75 MG PO TABS
75.0000 mg | ORAL_TABLET | Freq: Every day | ORAL | Status: DC
  Administered 2016-06-15 – 2016-06-16 (×2): 75 mg via ORAL
  Filled 2016-06-14 (×2): qty 1

## 2016-06-14 MED ORDER — GLYCOPYRROLATE 0.2 MG/ML IJ SOLN
INTRAMUSCULAR | Status: DC | PRN
  Administered 2016-06-14: 0.2 mg via INTRAVENOUS

## 2016-06-14 MED ORDER — IOPAMIDOL (ISOVUE-300) INJECTION 61%
INTRAVENOUS | Status: DC | PRN
  Administered 2016-06-14: 80 mL via INTRA_ARTERIAL

## 2016-06-14 MED ORDER — ASPIRIN EC 81 MG PO TBEC
81.0000 mg | DELAYED_RELEASE_TABLET | Freq: Every day | ORAL | Status: DC
  Administered 2016-06-15 – 2016-06-16 (×2): 81 mg via ORAL
  Filled 2016-06-14 (×2): qty 1

## 2016-06-14 MED ORDER — PROPOFOL 10 MG/ML IV BOLUS
INTRAVENOUS | Status: DC | PRN
  Administered 2016-06-14: 100 mg via INTRAVENOUS

## 2016-06-14 MED ORDER — SODIUM CHLORIDE 0.9 % IV SOLN
INTRAVENOUS | Status: DC | PRN
  Administered 2016-06-14: 17:00:00 via INTRAVENOUS

## 2016-06-14 MED ORDER — HYDRALAZINE HCL 20 MG/ML IJ SOLN
5.0000 mg | INTRAMUSCULAR | Status: AC | PRN
  Administered 2016-06-14 (×2): 5 mg via INTRAVENOUS

## 2016-06-14 MED ORDER — ONDANSETRON HCL 4 MG/2ML IJ SOLN
4.0000 mg | Freq: Four times a day (QID) | INTRAMUSCULAR | Status: DC | PRN
  Administered 2016-06-14: 4 mg via INTRAVENOUS
  Filled 2016-06-14: qty 2

## 2016-06-14 SURGICAL SUPPLY — 71 items
BLADE SURG 15 STRL LF DISP TIS (BLADE) ×1 IMPLANT
BLADE SURG 15 STRL SS (BLADE) ×2
BLADE SURG SZ11 CARB STEEL (BLADE) ×3 IMPLANT
BOOT SUTURE AID YELLOW STND (SUTURE) ×3 IMPLANT
BRUSH SCRUB 4% CHG (MISCELLANEOUS) ×3 IMPLANT
CANNULA 5F STIFF (CANNULA) ×3 IMPLANT
CATH ACCU-VU SIZ PIG 5F 70CM (CATHETERS) ×3 IMPLANT
CATH BALLN CODA 9X100X32 (BALLOONS) ×3 IMPLANT
CATH BEACON 5.038 65CM KMP-01 (CATHETERS) ×3 IMPLANT
CATH VERT 100CM (CATHETERS) ×6 IMPLANT
DERMABOND ADVANCED (GAUZE/BANDAGES/DRESSINGS) ×4
DERMABOND ADVANCED .7 DNX12 (GAUZE/BANDAGES/DRESSINGS) ×2 IMPLANT
DEVICE CLOSURE PERCLS PRGLD 6F (VASCULAR PRODUCTS) ×5 IMPLANT
DEVICE PRESTO INFLATION (MISCELLANEOUS) ×6 IMPLANT
DEVICE TORQUE (MISCELLANEOUS) ×3 IMPLANT
DRAPE BRACHIAL (DRAPES) ×6 IMPLANT
DRYSEAL FLEXSHEATH 12FR 33CM (SHEATH) ×2
DRYSEAL FLEXSHEATH 16FR 33CM (SHEATH) ×2
ELECT CAUTERY BLADE 6.4 (BLADE) ×6 IMPLANT
ELECT REM PT RETURN 9FT ADLT (ELECTROSURGICAL) ×6
ELECTRODE REM PT RTRN 9FT ADLT (ELECTROSURGICAL) ×2 IMPLANT
EXCLUDER TNK LEG 26MX12X16 (Endovascular Graft) ×1 IMPLANT
EXCLUDER TRUNK LEG 26MX12X16 (Endovascular Graft) ×3 IMPLANT
GLIDEWIRE ADV .035X260CM (WIRE) ×12 IMPLANT
GLIDEWIRE STIFF .35X180X3 HYDR (WIRE) ×3 IMPLANT
GLOVE BIO SURGEON STRL SZ7 (GLOVE) ×9 IMPLANT
GLOVE BIO SURGEON STRL SZ8 (GLOVE) ×3 IMPLANT
GOWN STRL REUS W/ TWL LRG LVL3 (GOWN DISPOSABLE) ×1 IMPLANT
GOWN STRL REUS W/ TWL XL LVL3 (GOWN DISPOSABLE) ×2 IMPLANT
GOWN STRL REUS W/TWL LRG LVL3 (GOWN DISPOSABLE) ×2
GOWN STRL REUS W/TWL XL LVL3 (GOWN DISPOSABLE) ×4
GRAFT EXCLUDER LEG 14.5X14 (Endovascular Graft) ×3 IMPLANT
GRAFT EXCLUDER LEG 16X12 (Endovascular Graft) ×3 IMPLANT
HEMOSTAT SURGICEL 2X3 (HEMOSTASIS) ×3 IMPLANT
LOOP RED MAXI  1X406MM (MISCELLANEOUS) ×4
LOOP VESSEL MAXI 1X406 RED (MISCELLANEOUS) ×2 IMPLANT
LOOP VESSEL MINI 0.8X406 BLUE (MISCELLANEOUS) ×2 IMPLANT
LOOPS BLUE MINI 0.8X406MM (MISCELLANEOUS) ×4
NEEDLE ENTRY 21GA 7CM ECHOTIP (NEEDLE) ×3 IMPLANT
PACK ANGIOGRAPHY (CUSTOM PROCEDURE TRAY) ×6 IMPLANT
PACK BASIN MAJOR ARMC (MISCELLANEOUS) ×3 IMPLANT
PENCIL ELECTRO HAND CTR (MISCELLANEOUS) ×3 IMPLANT
PERCLOSE PROGLIDE 6F (VASCULAR PRODUCTS) ×15
SET INTRO CAPELLA COAXIAL (SET/KITS/TRAYS/PACK) ×3 IMPLANT
SHEATH BRITE TIP 6FRX11 (SHEATH) ×6 IMPLANT
SHEATH BRITE TIP 7FRX11 (SHEATH) ×6 IMPLANT
SHEATH BRITE TIP 8FRX11 (SHEATH) ×6 IMPLANT
SHEATH DEST 7F 90CM MULTI- PUR (SHEATH) ×4 IMPLANT
SHEATH DRYSEAL FLEX 12FR 33CM (SHEATH) ×1 IMPLANT
SHEATH DRYSEAL FLEX 16FR 33CM (SHEATH) ×1 IMPLANT
SHIELD RADPAD DADD DRAPE 4X9 (MISCELLANEOUS) ×6 IMPLANT
SHIELD RADPAD SCOOP 12X17 (MISCELLANEOUS) ×6 IMPLANT
SPONGE XRAY 4X4 16PLY STRL (MISCELLANEOUS) ×12 IMPLANT
STENT VIABAHN VBX 5X39X135 (Permanent Stent) ×6 IMPLANT
SUT MNCRL 4-0 (SUTURE) ×4
SUT MNCRL 4-0 27XMFL (SUTURE) ×2
SUT PROLENE 6 0 BV (SUTURE) ×18 IMPLANT
SUT SILK 2 0 (SUTURE) ×2
SUT SILK 2-0 18XBRD TIE 12 (SUTURE) ×1 IMPLANT
SUT SILK 3 0 (SUTURE) ×2
SUT SILK 3-0 18XBRD TIE 12 (SUTURE) ×1 IMPLANT
SUT SILK 4 0 (SUTURE) ×2
SUT SILK 4-0 18XBRD TIE 12 (SUTURE) ×1 IMPLANT
SUT VIC AB 2-0 CT1 (SUTURE) ×6 IMPLANT
SUT VICRYL+ 3-0 36IN CT-1 (SUTURE) ×6 IMPLANT
SUTURE MNCRL 4-0 27XMF (SUTURE) ×2 IMPLANT
SYR 20CC LL (SYRINGE) ×3 IMPLANT
TOWEL OR 17X26 4PK STRL BLUE (TOWEL DISPOSABLE) ×12 IMPLANT
WIRE AMPLATZ SSTIFF .035X260CM (WIRE) ×6 IMPLANT
WIRE J 3MM .035X145CM (WIRE) ×6 IMPLANT
WIRE MAGIC TORQUE 260C (WIRE) ×6 IMPLANT

## 2016-06-14 NOTE — Progress Notes (Signed)
Labetalol 10mg  given for blood pressure of 189/97   Liquid band dsgs to left and right brachial areas dry and intact   Safe  Guards to both groins in with 9ml of air

## 2016-06-14 NOTE — H&P (Signed)
[facility] History & Physical Update  The patient was interviewed and re-examined.  The patient's previous History and Physical has been reviewed and is unchanged.  There is no change in the plan of care. We plan to proceed with the scheduled procedure.  Leotis Pain, MD  06/14/2016, 11:59 AM

## 2016-06-14 NOTE — Op Note (Addendum)
OPERATIVE NOTE   PROCEDURE: 1. US guidance for vascular access, bilateral femoral arteries 2. Catheter placement into aorta from bilateral femoral approaches 3. Bilateral cutdowns for brachial access  4. Catheter placement into the left renal artery from the right brachial approach and into the right renal artery from the left brachial approach with selective angiograms of bilateral renal arteries 5. Placement of a 26 mm diameter proximal 16 cm length Gore Excluder Endoprosthesis main body right with a 14 mm diameter by 14 cm left contralateral limb 6. Placement of a 5 mm diameter by 39 mm length VBX stent into the left renal artery 7. Placement of a 5 mm diameter by 39 mm length VBX stent into the right renal artery 8. Placement of a 12 mm diameter by 7 cm length right iliac extension limb 9. ProGlide closure devices bilateral femoral arteries  PRE-OPERATIVE DIAGNOSIS: AAA  POST-OPERATIVE DIAGNOSIS: same  SURGEON: Leotis Pain, MD and Hortencia Pilar, MD - Co-surgeons  ANESTHESIA: general  ESTIMATED BLOOD LOSS: 100 cc  FINDING(S): 1.  AAA  SPECIMEN(S):  none  INDICATIONS:   Molly Fisher is a 82 y.o. 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 Given her age and other issues, and endovascular repair was clearly preferred over an open approach. The anatomy was suitable for endovascular repair this would also require stents to the renal arteries to perform a snorkel-type procedure.  Risks and benefits of repair in an endovascular fashion were discussed and informed consent was obtained.  DESCRIPTION: After obtaining full informed written consent, the patient was brought back to the operating room and placed supine upon the operating table.  The patient received IV antibiotics prior to induction.  After obtaining adequate anesthesia, the patient was prepped and draped in the standard fashion for endovascular AAA repair.   We then began by gaining access to both femoral arteries with US guidance with me working on the right and Dr. Delana Meyer working on the left.  The femoral arteries were found to be patent and accessed without difficulty with a needle under ultrasound guidance without difficulty on each side and permanent images were recorded.  We then placed 2 proglide devices on each side in a pre-close fashion and placed 8 French sheaths. We then turned our attention to the antecubital fossa is on each side for a brachial cutdown. Again, I was working on the right and Dr. Delana Meyer was working on the left. Small oblique incisions were created at the antecubital fossa and the brachial artery was dissected out and encircled with Vesseloops proximally and distally. Access was then obtained with a Seldinger needle and 7 French sheaths were placed bilaterally. The patient was then given 7000 units of intravenous heparin. An additional thousand units of heparin and 12-1/2 g of mannitol were given later in the case The Pigtail catheter was placed into the aorta from the right side. Using this image, we selected a 26 mm diameter proximal 16 cm length Main body device.  Over a stiff wire, an 16 French sheath was placed up the right side. The main body was then placed through the 16 French sheath on the right side.  We then turned our attention to cannulating the renal arteries. From the right brachial approach, I was able to easily cannulate the left renal artery with an advantage wire and a Kumpe catheter. From the left brachial approach, Dr. Delana Meyer was able to easily cannulate the right renal artery  with an advantage wire and a Kumpe catheter. We then exchanged for a Magic torque wires on each side and advanced 7 [place] destination sheath into the renal arteries and selective imaging was performed on each side to confirm successful cannulation. We then selected a 5 mm diameter by 39 mm length VBX stents to place into the renal artery on  both sides. These were brought down into the renal artery taking care to avoid any proximal branches and maintain as much renal perfusion as possible. We also took care to make sure that the stent came up at least several millimeters above the main body of the stent graft.  A Kumpe catheter was placed up the left side and a magnified image at the renal arteries and also to evaluate the origin of the SMA was performed. The main body was then deployed just below the SMA. The Kumpe catheter was used to cannulate the contralateral gate without difficulty and successful cannulation was confirmed by twirling the pigtail catheter in the main body.  The VBX stents were then inflated first in the right renal artery up to 10 atm. The left renal artery VBX stent was then inflated up to 10 atm as well.  We then placed a stiff wire and a retrograde arteriogram was performed through the left  femoral sheath. We upsized to the 12 [place] sheathon the left  for the contralateral limb and a 14 mm diameter by 14 cm length  limb was selected and deployed. The main body deployment was then completed. Based off the angiographic findings, extension limbs were necessary.  A 12 mm diameter by 7 cm length right iliac extension limb was used to gain additional purchase and her short common iliac artery down to the iliac bifurcation taking care not to occlude the hypogastric artery. All junction points and seals zones were treated with the compliant balloon.  After using the compliant balloon in the proximal portion of the aorta stent graft, we reinflated the balloons for the VBX catheter up to 10 atm again to ensure patency. The pigtail catheter was then replaced and a completion angiogram was performed.  No obvious  Endoleak was detected on completion angiography. The renal arteries were found to be widely patent With stents in place bilaterally. The SMA had brisk flow with the stent graft deployed just below the base of the SMA. The  hypogastric arteries also were patent bilaterally. At this point we elected to terminate the procedure. We secured the pro glide devices for hemostasis on the femoral arteries. The skin incision was closed with a 4-0 Monocryl. Dermabond and pressure dressing were placed.  We then turned our attention to the brachial arteriotomies. Interrupted 6-0 Prolene sutures were used to close the brachial arteriotomies bilaterally with 3 sutures used on each side. The wound was irrigated and Surgicel was placed. The incisions were then closed with 3-0 Vicryl and 4-0 Monocryl. Dermabond was placed as a dressing.  The patient was taken to the recovery room in stable condition having tolerated the procedure well.  COMPLICATIONS: none  CONDITION: stable  Leotis Pain  06/14/2016, 5:01 PM   This note was created with Dragon Medical transcription system. Any errors in dictation are purely unintentional.

## 2016-06-14 NOTE — Transfer of Care (Signed)
Immediate Anesthesia Transfer of Care Note  Patient: Molly Fisher  Procedure(s) Performed: Procedure(s): Endovascular Repair/Stent Graft (N/A)  Patient Location: PACU  Anesthesia Type:General  Level of Consciousness: patient cooperative and lethargic  Airway & Oxygen Therapy: Patient Spontanous Breathing and Patient connected to face mask oxygen  Post-op Assessment: Report given to RN and Post -op Vital signs reviewed and stable  Post vital signs: Reviewed and stable  Last Vitals:  Vitals:   06/14/16 1127 06/14/16 1710  BP: (!) 160/79 (!) 182/91  Pulse: 77 96  Resp:  16  Temp: 36.7 C     Last Pain:  Vitals:   06/14/16 1127  TempSrc: Oral         Complications: No apparent anesthesia complications

## 2016-06-14 NOTE — Anesthesia Postprocedure Evaluation (Signed)
Anesthesia Post Note  Patient: Molly Fisher  Procedure(s) Performed: Procedure(s) (LRB): Endovascular Repair/Stent Graft (N/A)  Patient location during evaluation: Other Anesthesia Type: General Level of consciousness: awake and alert Pain management: pain level controlled Vital Signs Assessment: post-procedure vital signs reviewed and stable Respiratory status: spontaneous breathing, nonlabored ventilation, respiratory function stable and patient connected to nasal cannula oxygen Cardiovascular status: blood pressure returned to baseline and stable Postop Assessment: no signs of nausea or vomiting Anesthetic complications: no     Last Vitals:  Vitals:   06/14/16 2033 06/14/16 2040  BP: (!) 81/50 (!) 102/57  Pulse:  (!) 53  Resp:  14  Temp:  36.4 C    Last Pain:  Vitals:   06/14/16 2000  TempSrc: Rectal                 Marleni Gallardo S

## 2016-06-14 NOTE — Progress Notes (Signed)
Blood pressure remains 169/77   Hydralazine 5mg  given as per order

## 2016-06-14 NOTE — Anesthesia Procedure Notes (Signed)
Procedure Name: Intubation Date/Time: 06/14/2016 1:55 PM Performed by: Jonna Clark Pre-anesthesia Checklist: Patient identified, Patient being monitored, Timeout performed, Emergency Drugs available and Suction available Patient Re-evaluated:Patient Re-evaluated prior to inductionOxygen Delivery Method: Circle system utilized Preoxygenation: Pre-oxygenation with 100% oxygen Intubation Type: IV induction Ventilation: Mask ventilation without difficulty Laryngoscope Size: Mac and 3 Grade View: Grade I Tube type: Oral Tube size: 7.0 mm Number of attempts: 1 Placement Confirmation: ETT inserted through vocal cords under direct vision,  positive ETCO2 and breath sounds checked- equal and bilateral Secured at: 21 cm Tube secured with: Tape Dental Injury: Teeth and Oropharynx as per pre-operative assessment

## 2016-06-14 NOTE — Anesthesia Preprocedure Evaluation (Signed)
Anesthesia Evaluation  Patient identified by MRN, date of birth, ID band Patient awake    Reviewed: Allergy & Precautions, NPO status , Patient's Chart, lab work & pertinent test results  Airway Mallampati: III       Dental  (+) Teeth Intact   Pulmonary    breath sounds clear to auscultation       Cardiovascular Exercise Tolerance: Good + Peripheral Vascular Disease   Rhythm:Regular Rate:Normal     Neuro/Psych Anxiety Depression negative neurological ROS     GI/Hepatic Neg liver ROS, GERD  Medicated,  Endo/Other  negative endocrine ROS  Renal/GU negative Renal ROS     Musculoskeletal   Abdominal Normal abdominal exam  (+)   Peds negative pediatric ROS (+)  Hematology negative hematology ROS (+)   Anesthesia Other Findings   Reproductive/Obstetrics                             Anesthesia Physical Anesthesia Plan  ASA: III  Anesthesia Plan: General   Post-op Pain Management:    Induction: Intravenous  Airway Management Planned: Oral ETT  Additional Equipment: Arterial line  Intra-op Plan:   Post-operative Plan:   Informed Consent: I have reviewed the patients History and Physical, chart, labs and discussed the procedure including the risks, benefits and alternatives for the proposed anesthesia with the patient or authorized representative who has indicated his/her understanding and acceptance.     Plan Discussed with: CRNA  Anesthesia Plan Comments:         Anesthesia Quick Evaluation

## 2016-06-14 NOTE — Op Note (Signed)
OPERATIVE NOTE   PROCEDURE: 1. US guidance for vascular access, bilateral femoral arteries 2. Catheter placement into aorta from bilateral femoral approaches 3. Placement of a 26 x 12 x 16 C3 Gore Excluder Endoprosthesis main body with a 14 x 14 contralateral limb 4. Placement of a 16 x 12 x 7 right iliac extender 5. Introduction catheter into the left renal artery right brachial approach by Dr. Lucky Cowboy 6. Open angioplasty and stent placement left renal artery by Dr. Lucky Cowboy with a 5 x 39 Gore VBX 7. Introduction catheter into right renal artery by Dr. Delana Meyer 8. Open angioplasty and stent placement right renal artery by Dr. Delana Meyer with a 5 x 39 Gore VBX 9.  ProGlide closure devices bilateral femoral arteries  PRE-OPERATIVE DIAGNOSIS: AAA juxtarenal; bilateral renal artery stenosis; hypertension  POST-OPERATIVE DIAGNOSIS: same  SURGEON: Hortencia Pilar, MD and Leotis Pain, MD - Co-surgeons  ANESTHESIA: general  ESTIMATED BLOOD LOSS: 200 cc  FINDING(S): 1.  AAA  SPECIMEN(S):  none  INDICATIONS:   Molly Fisher is an 82 y.o. y.o. female who presents with greater than 6 cm abdominal aortic aneurysm associated with bilateral renal artery atherosclerotic disease.  The risks and benefits for endovascular repair were reviewed in detail all questions have been answered and the patient wishes to proceed with EVAR stent grafting  DESCRIPTION: After obtaining full informed written consent, the patient was brought back to the operating room and placed supine upon the operating table.  The patient received IV antibiotics prior to induction.  After obtaining adequate anesthesia, the patient was prepped and draped in the standard fashion for endovascular AAA repair.  We then began by gaining access to both femoral arteries with US guidance with me working on the left and Dr. Lucky Cowboy working on the right.  The femoral arteries were found to be patent and accessed without difficulty with a needle under  ultrasound guidance without difficulty on each side and permanent images were recorded.  Both punctures were performed under real-time ultrasound visualization. 6 French sheath was then placed in a J-wire advanced into the abdominal aorta. We then placed 2 proglide devices on each side in a pre-close fashion and replaced the previous 6 [place] sheaths with 8 [place] sheaths.  Attention was then turned to the brachial arteries bilaterally. The patient had been positioned with both arms extended [facility] port allowing access to the antecubital fossa. With Dr. Lucky Cowboy working on the patient's right and myself working on the patient's left linear incisions were then created overlying the brachial artery just above the antecubital fossa and the dissection carried down through the soft tissues opening the sheath and identifying the brachial artery. Brachial artery was then looped proximally and distally with Silastic Vesseloops. Following this a micropuncture needle was then inserted directly into the brachial artery and a microwire advanced followed by the micro-sheath and then the J-wire and a 6 French 11 cm sheath. This was performed on both the right and left working simultaneously. Subsequently, advantage wires were negotiated into the descending aorta and then into the proximal abdominal aorta. Seven French 90 cm Pinnacle multipurpose sheaths were advanced in exchange for the 6 French sheaths so that the tips of the pinnacle sheaths were located at the level of T12. Hand injection of contrast through the sheaths was then used to identify the renal arteries and subsequently using a KMP catheter and the advantage wire first the left renal was selected and then the right renal was selected hand injection of  contrast was used to demonstrate the renal anatomy and then a Magic torque wire exchanged followed by advancing of the catheter and then subsequently advancing the sheath into the renal arteries bilaterally.  The patient  was then given  5000 units of intravenous heparin after the 6 French brachial sheath were placed and this was allowed to circulate for approximately 6 minutes prior to advancing the 7 French sheaths into the brachial arteries.   At this point two 5 x 39 Gore VBX stents were opened onto the field advanced over the Magic torque wires and positioned into the renal arteries.  The Pigtail catheter was placed into the aorta from the  left side. Using this image, we selected a 26 x 12 x 16 Main body device.  Over a stiff wire, an 73 French sheath was placed. The main body was then placed through the 18 French sheath.   The pigtail catheter was maintained up the left femoral approach and using a series of oblique images obtained through the pigtail catheter first the renal stents were positioned for optimal result and then within even steeper oblique the origin of the SMA was identified and this was marked as the level just below which the main body of the stent graft will be opened.  The main body was then deployed just below the superior mesenteric artery. Both of the VBX stents were then deployed.  Hand injection of contrast through the multipurpose sheaths demonstrated patency of the VBX stents with filling of both left and right renals. Subsequently the pigtail catheter was removed over a wire and the wire pulled back into the distal aneurysm sac. Kumpe catheter was then advanced over the stiff angle Glidewire from the left side. The Kumpe catheter was used to cannulate the contralateral gate without difficulty and successful cannulation was confirmed by twirling the pigtail catheter in the main body. We then placed a stiff wire and a retrograde arteriogram was performed through the left femoral sheath. We upsized to the 12 [place] sheath for the contralateral limb and a 14 x 14 limb was selected and deployed. The main body deployment was then completed. Based off the angiographic findings, extension limbs were  necessary on the right.  A 16 x 12 x 7 extender was then advanced up the right sheath and deployed without difficulty.  All junction points and seals zones were treated with the compliant balloon. After performing this angioplasty on the proximal portion of the stent graft main body the noncompliant balloons were reintroduced into the VBX stents and these renal stents were reexpanded with a 9 atm inflation 30-40 seconds.   The pigtail catheter was then replaced and a completion angiogram was performed.   No Endoleak was detected on completion angiography. The renal arteries were found to be widely patent. Bilateral internal iliac arteries were also maintained and the external iliac arteries appeared widely patent.   At this point we elected to terminate the procedure. Once again, working simultaneously, with Dr. Lucky Cowboy on the right side and myself on the left the sheaths were removed from the brachial arteries the vessel loops were used to secure vascular control and subsequently the brachial arteriotomies were closed using 3 interrupted 6-0 Prolene sutures. Flushing maneuvers were performed and flow was established distally to the hand. Excellent distal pulse was noted bilaterally. Surgicel was then placed along the suture line. The wounds were then closed in layers using 3-0 Vicryl followed by 4-0 Monocryl subcuticular and then Dermabond.  We secured the  pro glide devices for hemostasis on the femoral arteries, closing the right side first and then the left. The skin incision was closed with a 4-0 Monocryl. Dermabond and pressure dressing were placed. The patient was taken to the recovery room in stable condition having tolerated the procedure well.  COMPLICATIONS: none  CONDITION: stable  Katha Cabal  06/14/2016, 5:51 PM

## 2016-06-15 ENCOUNTER — Encounter: Payer: Self-pay | Admitting: Vascular Surgery

## 2016-06-15 LAB — BASIC METABOLIC PANEL
ANION GAP: 5 (ref 5–15)
BUN: 20 mg/dL (ref 6–20)
CALCIUM: 8.6 mg/dL — AB (ref 8.9–10.3)
CO2: 25 mmol/L (ref 22–32)
CREATININE: 1.3 mg/dL — AB (ref 0.44–1.00)
Chloride: 111 mmol/L (ref 101–111)
GFR, EST AFRICAN AMERICAN: 43 mL/min — AB (ref >60)
GFR, EST NON AFRICAN AMERICAN: 37 mL/min — AB (ref >60)
Glucose, Bld: 154 mg/dL — ABNORMAL HIGH (ref 65–99)
Potassium: 3.9 mmol/L (ref 3.5–5.1)
Sodium: 141 mmol/L (ref 135–145)

## 2016-06-15 LAB — CBC
HCT: 29.4 % — ABNORMAL LOW (ref 35.0–47.0)
Hemoglobin: 9.9 g/dL — ABNORMAL LOW (ref 12.0–16.0)
MCH: 28.5 pg (ref 26.0–34.0)
MCHC: 33.8 g/dL (ref 32.0–36.0)
MCV: 84.3 fL (ref 80.0–100.0)
PLATELETS: 172 10*3/uL (ref 150–440)
RBC: 3.49 MIL/uL — ABNORMAL LOW (ref 3.80–5.20)
RDW: 14.9 % — AB (ref 11.5–14.5)
WBC: 10.1 10*3/uL (ref 3.6–11.0)

## 2016-06-15 MED ORDER — ORAL CARE MOUTH RINSE
15.0000 mL | Freq: Two times a day (BID) | OROMUCOSAL | Status: DC
  Administered 2016-06-15 – 2016-06-16 (×3): 15 mL via OROMUCOSAL

## 2016-06-15 MED ORDER — FAMOTIDINE 20 MG PO TABS
20.0000 mg | ORAL_TABLET | Freq: Two times a day (BID) | ORAL | Status: DC
Start: 2016-06-15 — End: 2016-06-16
  Administered 2016-06-15 – 2016-06-16 (×3): 20 mg via ORAL
  Filled 2016-06-15 (×3): qty 1

## 2016-06-15 NOTE — Anesthesia Postprocedure Evaluation (Signed)
Anesthesia Post Note  Patient: Molly Fisher  Procedure(s) Performed: Procedure(s) (LRB): Endovascular Repair/Stent Graft (N/A)  Patient location during evaluation: SICU Anesthesia Type: General Level of consciousness: awake and alert Pain management: pain level controlled Vital Signs Assessment: post-procedure vital signs reviewed and stable Respiratory status: spontaneous breathing and nonlabored ventilation Cardiovascular status: stable Anesthetic complications: no     Last Vitals:  Vitals:   06/15/16 0500 06/15/16 0600  BP: 114/77 134/64  Pulse: 88 70  Resp: (!) 23 (!) 22  Temp: 37.2 C 37.2 C    Last Pain:  Vitals:   06/14/16 2000  TempSrc: Rectal                 Rolla Plate P

## 2016-06-15 NOTE — Care Management (Signed)
Notified by Jocelyn Lamer with [facility] home [facility] that they are open to home health with this patient. I have requested resumption of care orders from MD. No other RNCM needs.

## 2016-06-15 NOTE — Progress Notes (Signed)
[facility]  Daily Progress Note   Subjective  - 1 Day Post-Op  Some confusion Pain not too bad.  Making urine. No major events overnight  Objective Vitals:   06/15/16 0500 06/15/16 0600 06/15/16 0700 06/15/16 0800  BP: 114/77 134/64 121/66 (!) 143/54  Pulse: 88 70 65 72  Resp: (!) 23 (!) 22 15 18   Temp: 99 F (37.2 C) 99 F (37.2 C) 99 F (37.2 C) 98.6 F (37 C)  TempSrc:    Rectal  SpO2: 98% 100% 99% 99%  Weight:      Height:        Intake/Output Summary (Last 24 hours) at 06/15/16 1436 Last data filed at 06/15/16 1410  Gross per 24 hour  Intake          3396.67 ml  Output              870 ml  Net          2526.67 ml    PULM  CTAB CV  RRR VASC  2+ pedal pulses, 2+ radial pulses. Mild bruising at access sites  Laboratory CBC    Component Value Date/Time   WBC 10.1 06/15/2016 0614   HGB 9.9 (L) 06/15/2016 0614   HGB 13.6 04/22/2014 1519   HCT 29.4 (L) 06/15/2016 0614   PLT 172 06/15/2016 0614    BMET    Component Value Date/Time   NA 141 06/15/2016 0614   NA 142 04/22/2014 1519   K 3.9 06/15/2016 0614   K 3.8 04/22/2014 1519   CL 111 06/15/2016 0614   CL 106 04/22/2014 1519   CO2 25 06/15/2016 0614   CO2 28 04/22/2014 1519   GLUCOSE 154 (H) 06/15/2016 0614   GLUCOSE 95 04/22/2014 1519   BUN 20 06/15/2016 0614   BUN 11 04/22/2014 1519   CREATININE 1.30 (H) 06/15/2016 0614   CREATININE 0.85 04/22/2014 1519   CALCIUM 8.6 (L) 06/15/2016 0614   CALCIUM 8.9 04/22/2014 1519   GFRNONAA 37 (L) 06/15/2016 0614   GFRNONAA >60 04/22/2014 1519   GFRAA 43 (L) 06/15/2016 0614   GFRAA >60 04/22/2014 1519    Assessment/Planning: POD #1 s/p bilateral renal stents and endovascular AAA repair   Will keep another day to recheck Cr. Tomorrow  OOB. Increase activity.    Leotis Pain  06/15/2016, 2:36 PM

## 2016-06-15 NOTE — Addendum Note (Signed)
Addendum  created 06/15/16 [id number] by Rolla Plate, CRNA   Sign clinical note

## 2016-06-15 NOTE — Progress Notes (Signed)
CONCERNING: IV to Oral Route Change Policy  RECOMMENDATION: This patient is receiving famotidine by the intravenous route.  Based on criteria approved by the Pharmacy and Therapeutics Committee, the intravenous medication(s) is/are being converted to the equivalent oral dose form(s).   DESCRIPTION: These criteria include:  The patient is eating (either orally or via tube) and/or has been taking other orally administered medications for a least 24 hours  The patient has no evidence of active gastrointestinal bleeding or impaired GI absorption (gastrectomy, short bowel, patient on TNA or NPO).  If you have questions about this conversion, please contact the Pharmacy Department  []   [phone] )  Molly Fisher [x]   [phone] )  Molly Fisher []   [phone] )  Molly Fisher []   [phone]  Molly Fisher []   [phone] )  Molly Fisher, Molly Fisher 06/15/2016 9:54 AM

## 2016-06-16 LAB — BASIC METABOLIC PANEL
ANION GAP: 4 — AB (ref 5–15)
BUN: 20 mg/dL (ref 6–20)
CALCIUM: 8.3 mg/dL — AB (ref 8.9–10.3)
CO2: 25 mmol/L (ref 22–32)
Chloride: 116 mmol/L — ABNORMAL HIGH (ref 101–111)
Creatinine, Ser: 1.01 mg/dL — ABNORMAL HIGH (ref 0.44–1.00)
GFR calc non Af Amer: 50 mL/min — ABNORMAL LOW (ref >60)
GFR, EST AFRICAN AMERICAN: 58 mL/min — AB (ref >60)
Glucose, Bld: 113 mg/dL — ABNORMAL HIGH (ref 65–99)
Potassium: 3.6 mmol/L (ref 3.5–5.1)
Sodium: 145 mmol/L (ref 135–145)

## 2016-06-16 LAB — CBC
HCT: 23.3 % — ABNORMAL LOW (ref 35.0–47.0)
HEMOGLOBIN: 8 g/dL — AB (ref 12.0–16.0)
MCH: 28.5 pg (ref 26.0–34.0)
MCHC: 34.3 g/dL (ref 32.0–36.0)
MCV: 83.1 fL (ref 80.0–100.0)
Platelets: 127 10*3/uL — ABNORMAL LOW (ref 150–440)
RBC: 2.81 MIL/uL — AB (ref 3.80–5.20)
RDW: 15.1 % — ABNORMAL HIGH (ref 11.5–14.5)
WBC: 7.2 10*3/uL (ref 3.6–11.0)

## 2016-06-16 MED ORDER — FUROSEMIDE 10 MG/ML IJ SOLN
20.0000 mg | Freq: Once | INTRAMUSCULAR | Status: AC
  Administered 2016-06-16: 20 mg via INTRAVENOUS
  Filled 2016-06-16: qty 2

## 2016-06-16 MED ORDER — CLOPIDOGREL BISULFATE 75 MG PO TABS: 75.0000 mg | ORAL_TABLET | Freq: Every day | ORAL | 3 refills | Status: DC

## 2016-06-16 MED ORDER — TRAMADOL HCL 50 MG PO TABS: 100.0000 mg | ORAL_TABLET | Freq: Four times a day (QID) | ORAL | 0 refills | Status: DC | PRN

## 2016-06-16 MED ORDER — ASPIRIN 81 MG PO TBEC: 81.0000 mg | DELAYED_RELEASE_TABLET | Freq: Every day | ORAL | 3 refills | Status: DC

## 2016-06-16 NOTE — Care Management (Signed)
Requested resumption of home health order from Dr. Lucky Cowboy.  Vickie with [facility] notified of discharge.

## 2016-06-16 NOTE — Progress Notes (Signed)
Pt A and O x 4. VSS. Pt tolerating diet well. No complaints of pain or nausea. IV removed intact, prescriptions given. Pt voiced understanding of discharge instructions with no further questions. Pt discharged via wheelchair with nurse aide.   

## 2016-06-16 NOTE — Care Management Important Message (Signed)
Important Message  Patient Details  Name: RAYMONA BROACH MRN: [id number] Date of Birth: 1930/10/28   Medicare Important Message Given:  N/A - LOS <3 / Initial given by admissions    Beverly Sessions, RN 06/16/2016, 10:12 AM

## 2016-06-16 NOTE — Progress Notes (Deleted)
Per Dr. Posey Pronto okay to place order for IVC sitter

## 2016-06-16 NOTE — Discharge Summary (Signed)
[facility]    Discharge Summary    Patient ID:  Molly Fisher MRN: [id number] DOB/AGE: [birth year] 82 y.o.  Admit date: 06/14/2016 Discharge date: 06/16/2016 Date of Surgery: 06/14/2016 Surgeon: Surgeon(s): Algernon Huxley, MD Katha Cabal, MD  Admission Diagnosis: Endovascular repair     AAA      GORE rep cc: [place]  Discharge Diagnoses:  Endovascular repair     AAA      GORE rep cc: M Godley  S Wiley  Secondary Diagnoses: Past Medical History:  Diagnosis Date  . Anxiety   . Arthritis   . Cancer (HCC)    squamous cell skin cancer  . Depression   . GERD (gastroesophageal reflux disease)   . Hypercholesteremia   . Lumbar pain    protrusion and stenosis    Procedure(s): Endovascular Repair/Stent Graft  Discharged Condition: good  HPI:  Patient brought in for elective AAA repair with snorkel technique to the renal arteries.  Hospital Course:  Molly Fisher is a 82 y.o. female is S/P Neither Procedure(s): Endovascular Repair/Stent Graft and bilateral renal artery stent placements Extubated: POD # 0 Physical exam: good pulses, Abdomen soft Post-op wounds clean, dry, intact or healing well Pt. Ambulating, voiding and taking PO diet without difficulty. Pt pain controlled with PO pain meds. Labs as below Complications:none  Consults:    Significant Diagnostic Studies: CBC Lab Results  Component Value Date   WBC 7.2 06/16/2016   HGB 8.0 (L) 06/16/2016   HCT 23.3 (L) 06/16/2016   MCV 83.1 06/16/2016   PLT 127 (L) 06/16/2016    BMET    Component Value Date/Time   NA 145 06/16/2016 0515   NA 142 04/22/2014 1519   K 3.6 06/16/2016 0515   K 3.8 04/22/2014 1519   CL 116 (H) 06/16/2016 0515   CL 106 04/22/2014 1519   CO2 25 06/16/2016 0515   CO2 28 04/22/2014 1519   GLUCOSE 113 (H) 06/16/2016 0515   GLUCOSE 95 04/22/2014 1519   BUN 20 06/16/2016 0515   BUN 11 04/22/2014 1519   CREATININE 1.01 (H)  06/16/2016 0515   CREATININE 0.85 04/22/2014 1519   CALCIUM 8.3 (L) 06/16/2016 0515   CALCIUM 8.9 04/22/2014 1519   GFRNONAA 50 (L) 06/16/2016 0515   GFRNONAA >60 04/22/2014 1519   GFRAA 58 (L) 06/16/2016 0515   GFRAA >60 04/22/2014 1519   COAG Lab Results  Component Value Date   INR 1.12 06/06/2016   INR 1.33 08/25/2015   INR 1.30 08/11/2015     Disposition:  Discharge to :Home Discharge Instructions    Discharge patient    Complete by:  As directed      Allergies as of 06/16/2016      Reactions   Statins Other (See Comments)   Cramping in her legs and uneasy feeling.      Medication List    TAKE these medications   acetaminophen 500 MG tablet Commonly known as:  TYLENOL Take 500-1,000 mg by mouth every 6 (six) hours as needed (for pain).   aspirin 81 MG EC tablet Take 1 tablet (81 mg total) by mouth daily.   Biotin 10000 MCG Tabs Take 10,000 mcg by mouth daily.   clopidogrel 75 MG tablet Commonly known as:  PLAVIX Take 1 tablet (75 mg total) by mouth daily.   LORazepam 1 MG tablet Commonly known as:  ATIVAN Take 1 mg by mouth every 12 (twelve) hours  as needed for anxiety.   naproxen sodium 220 MG tablet Commonly known as:  ANAPROX Take 220-440 mg by mouth 2 (two) times daily as needed (for pain.).   traMADol 50 MG tablet Commonly known as:  ULTRAM Take 2 tablets (100 mg total) by mouth every 6 (six) hours as needed for moderate pain. What changed:  how much to take  reasons to take this   vitamin C 1000 MG tablet Take 1,000 mg by mouth daily.      Verbal and written Discharge instructions given to the patient. Wound care per Discharge AVS Follow-up Information    KIMBERLY A STEGMAYER, PA-C Follow up in 3 week(s).   Specialty:  Physician Assistant Why:  with EVAR duplex Contact information: [place] [id number]           Signed: Leotis Pain, MD  06/16/2016, 8:50 AM

## 2016-06-17 LAB — TYPE AND SCREEN
ABO/RH(D): O POS
ANTIBODY SCREEN: NEGATIVE
UNIT DIVISION: 0
Unit division: 0

## 2016-06-19 LAB — GLUCOSE, CAPILLARY: GLUCOSE-CAPILLARY: 163 mg/dL — AB (ref 65–99)

## 2016-06-23 ENCOUNTER — Telehealth (INDEPENDENT_AMBULATORY_CARE_PROVIDER_SITE_OTHER): Payer: Self-pay

## 2016-06-23 NOTE — Telephone Encounter (Signed)
Sherrie from [facility] called to let us know the patient is refusing service today and states she will accept service again next week.

## 2016-07-04 ENCOUNTER — Telehealth (INDEPENDENT_AMBULATORY_CARE_PROVIDER_SITE_OTHER): Payer: Self-pay | Admitting: Vascular Surgery

## 2016-07-04 NOTE — Telephone Encounter (Signed)
Patient originally had appt scheduled on 07/06/16 and rescheduled to 07/28/16 due to weather. She also has a home health nurse from [facility] is coming out that a decision was supposed to be made on this week at her appt to see whether she still needs it. Larene Beach (granddaughter) would like to know what should be done about the nurse. [phone]

## 2016-07-04 NOTE — Telephone Encounter (Signed)
If the patient does not have wound care, medication or physical therapy needs - then she does not need home health care. Home health care should know if she is safe to be discharged from their care. I can't make that decision until she is seen unless I speak with home health care etc

## 2016-07-06 ENCOUNTER — Other Ambulatory Visit (INDEPENDENT_AMBULATORY_CARE_PROVIDER_SITE_OTHER): Payer: Medicare Other

## 2016-07-06 ENCOUNTER — Ambulatory Visit (INDEPENDENT_AMBULATORY_CARE_PROVIDER_SITE_OTHER): Payer: Medicare Other | Admitting: Vascular Surgery

## 2016-07-28 ENCOUNTER — Ambulatory Visit (INDEPENDENT_AMBULATORY_CARE_PROVIDER_SITE_OTHER): Payer: Medicare Other | Admitting: Vascular Surgery

## 2016-07-28 ENCOUNTER — Other Ambulatory Visit (INDEPENDENT_AMBULATORY_CARE_PROVIDER_SITE_OTHER): Payer: Self-pay | Admitting: Vascular Surgery

## 2016-07-28 ENCOUNTER — Other Ambulatory Visit (INDEPENDENT_AMBULATORY_CARE_PROVIDER_SITE_OTHER): Payer: Medicare Other

## 2016-07-28 ENCOUNTER — Encounter (INDEPENDENT_AMBULATORY_CARE_PROVIDER_SITE_OTHER): Payer: Self-pay | Admitting: Vascular Surgery

## 2016-07-28 VITALS — BP 143/78 | HR 81 | Resp 16 | Ht 67.0 in | Wt 162.0 lb

## 2016-07-28 DIAGNOSIS — I714 Abdominal aortic aneurysm, without rupture, unspecified: Secondary | ICD-10-CM

## 2016-07-28 DIAGNOSIS — Z9889 Other specified postprocedural states: Secondary | ICD-10-CM

## 2016-07-28 DIAGNOSIS — Z8679 Personal history of other diseases of the circulatory system: Secondary | ICD-10-CM

## 2016-07-28 DIAGNOSIS — E785 Hyperlipidemia, unspecified: Secondary | ICD-10-CM

## 2016-07-28 NOTE — Progress Notes (Signed)
Patient ID: Molly Fisher, female   DOB: 20-Nov-1930, 83 y.o.   MRN: [id number]  Chief Complaint  Patient presents with  . Routine Post Op    3 week follow up    HPI Molly Fisher is a 83 y.o. female.  Patient returns 3 weeks after endovascular abdominal aortic aneurysm repair as well as bilateral renal artery stent placements. She says she is feeling quite well and her legs actually feel much better with resolution of her previous claudication symptoms. She has resumed most normal activities. She had no periprocedural complications. Her duplex today shows some decrease in the size of her abdominal aortic aneurysm now measuring 6.0 x 5.4 cm in maximal diameter. No endoleak is present.   Past Medical History:  Diagnosis Date  . Anxiety   . Arthritis   . Cancer (HCC)    squamous cell skin cancer  . Depression   . GERD (gastroesophageal reflux disease)   . Hypercholesteremia   . Lumbar pain    protrusion and stenosis    Past Surgical History:  Procedure Laterality Date  . CATARACT EXTRACTION, BILATERAL    . COLONOSCOPY    . DILATION AND CURETTAGE OF UTERUS    . EYE SURGERY    . FRACTURE SURGERY Right    shoulder  . JOINT REPLACEMENT Left    tkr  . KNEE ARTHROPLASTY Left 08/25/2015   Procedure: COMPUTER ASSISTED TOTAL KNEE ARTHROPLASTY;  Surgeon: Dereck Leep, MD;  Location: [facility];  Service: Orthopedics;  Laterality: Left;  . PERIPHERAL VASCULAR CATHETERIZATION N/A 06/14/2016   Procedure: Endovascular Repair/Stent Graft;  Surgeon: Algernon Huxley, MD;  Location: [facility];  Service: Cardiovascular;  Laterality: N/A;      Allergies  Allergen Reactions  . Statins Other (See Comments)    Cramping in her legs and uneasy feeling.    Current Outpatient Prescriptions  Medication Sig Dispense Refill  . acetaminophen (TYLENOL) 500 MG tablet Take 500-1,000 mg by mouth every 6 (six) hours as needed (for pain).    . Ascorbic Acid (VITAMIN C) 1000 MG tablet Take  1,000 mg by mouth daily.    Marland Kitchen aspirin EC 81 MG EC tablet Take 1 tablet (81 mg total) by mouth daily. 90 tablet 3  . Biotin 10000 MCG TABS Take 10,000 mcg by mouth daily.    . clopidogrel (PLAVIX) 75 MG tablet Take 1 tablet (75 mg total) by mouth daily. 30 tablet 3  . LORazepam (ATIVAN) 1 MG tablet Take 1 mg by mouth every 12 (twelve) hours as needed for anxiety.    . naproxen sodium (ANAPROX) 220 MG tablet Take 220-440 mg by mouth 2 (two) times daily as needed (for pain.).    Marland Kitchen traMADol (ULTRAM) 50 MG tablet Take 2 tablets (100 mg total) by mouth every 6 (six) hours as needed for moderate pain. 40 tablet 0   No current facility-administered medications for this visit.         Physical Exam BP (!) 143/78 (BP Location: Right Arm)   Pulse 81   Resp 16   Ht 5\' 7"  (1.702 m)   Wt 73.5 kg (162 lb)   BMI 25.37 kg/m  Gen:  WD/WN, NAD Skin: incision C/D/I     Assessment/Plan:  AAA (abdominal aortic aneurysm) without rupture (HCC) Doing well after endovascular aneurysm repair. Also with bilateral renal artery stent placement for snorkel technique. We'll continue Plavix for 3 months secondary to the renal artery stents. Her duplex  today shows some decrease in the size of her abdominal aortic aneurysm now measuring 6.0 x 5.4 cm in maximal diameter. No endoleak is present. Return in 3 months with duplex      Leotis Pain 07/28/2016, 9:29 AM   This note was created with Dragon medical transcription system.  Any errors from dictation are unintentional.

## 2016-07-28 NOTE — Assessment & Plan Note (Signed)
Doing well after endovascular aneurysm repair. Also with bilateral renal artery stent placement for snorkel technique. We'll continue Plavix for 3 months secondary to the renal artery stents. Her duplex today shows some decrease in the size of her abdominal aortic aneurysm now measuring 6.0 x 5.4 cm in maximal diameter. No endoleak is present. Return in 3 months with duplex

## 2016-08-22 ENCOUNTER — Telehealth (INDEPENDENT_AMBULATORY_CARE_PROVIDER_SITE_OTHER): Payer: Self-pay | Admitting: Vascular Surgery

## 2016-08-22 NOTE — Telephone Encounter (Signed)
Leg pain when standing, states can hardly walk, is using walker. Her granddaughter suggested she may need an ultrasound. [phone]

## 2016-08-22 NOTE — Telephone Encounter (Signed)
Patient was last seen 08/07/16. See note below.

## 2016-08-23 NOTE — Telephone Encounter (Signed)
Patient will be scheduled for ABI's and an EVAR per Dr. Lucky Cowboy.

## 2016-08-24 ENCOUNTER — Other Ambulatory Visit (INDEPENDENT_AMBULATORY_CARE_PROVIDER_SITE_OTHER): Payer: Medicare Other

## 2016-08-24 ENCOUNTER — Other Ambulatory Visit (INDEPENDENT_AMBULATORY_CARE_PROVIDER_SITE_OTHER): Payer: Self-pay | Admitting: Vascular Surgery

## 2016-08-24 ENCOUNTER — Ambulatory Visit (INDEPENDENT_AMBULATORY_CARE_PROVIDER_SITE_OTHER): Payer: Medicare Other | Admitting: Vascular Surgery

## 2016-08-24 ENCOUNTER — Encounter (INDEPENDENT_AMBULATORY_CARE_PROVIDER_SITE_OTHER): Payer: Self-pay | Admitting: Vascular Surgery

## 2016-08-24 VITALS — BP 184/76 | HR 66 | Resp 17 | Wt 165.0 lb

## 2016-08-24 DIAGNOSIS — G8929 Other chronic pain: Secondary | ICD-10-CM

## 2016-08-24 DIAGNOSIS — M79604 Pain in right leg: Secondary | ICD-10-CM | POA: Diagnosis not present

## 2016-08-24 DIAGNOSIS — I714 Abdominal aortic aneurysm, without rupture, unspecified: Secondary | ICD-10-CM

## 2016-08-24 DIAGNOSIS — M5442 Lumbago with sciatica, left side: Secondary | ICD-10-CM

## 2016-08-24 DIAGNOSIS — E785 Hyperlipidemia, unspecified: Secondary | ICD-10-CM

## 2016-08-24 DIAGNOSIS — M79605 Pain in left leg: Principal | ICD-10-CM

## 2016-08-30 NOTE — Progress Notes (Signed)
Subjective:    Patient ID: Molly Fisher, female    DOB: 1930-11-16, 83 y.o.   MRN: [id number] Chief Complaint  Patient presents with  . Follow-up   Patient with recent endovascular AAA repair (06/14/17). Patient has been experiencing lower extremity pain. She states pain not always associated with activity however activity does worsen it. Describes pain as shooting down back of legs. Patient endorses a history of back pain and neuropathy. She sees a Restaurant manager, fast food for this which she hasn't seen since her AAA repair.  The patient underwent an ABI which showed no significant lower extremity arterial disease (no previous for comparison). Patient denies any rest pain or ulcerations to her lower extremity.    Review of Systems  Constitutional: Negative.   HENT: Negative.   Eyes: Negative.   Respiratory: Negative.   Cardiovascular:       AAA Lower Extremity Pain  Gastrointestinal: Negative.   Endocrine: Negative.   Genitourinary: Negative.   Musculoskeletal: Negative.   Skin: Negative.   Allergic/Immunologic: Negative.   Neurological: Negative.   Hematological: Negative.   Psychiatric/Behavioral: Negative.       Objective:   Physical Exam  Constitutional: She is oriented to person, place, and time. She appears well-developed and well-nourished.  HENT:  Head: Normocephalic and atraumatic.  Right Ear: External ear normal.  Left Ear: External ear normal.  Neck: Normal range of motion.  Cardiovascular: Normal rate, regular rhythm and normal heart sounds.   Pulses:      Radial pulses are 2+ on the right side, and 2+ on the left side.       Dorsalis pedis pulses are 2+ on the right side, and 2+ on the left side.       Posterior tibial pulses are 2+ on the right side, and 2+ on the left side.  Pulmonary/Chest: Effort normal and breath sounds normal.  Abdominal: Soft. Bowel sounds are normal.  Musculoskeletal: Normal range of motion. She exhibits no edema.  Neurological: She is  alert and oriented to person, place, and time.  Skin: Skin is warm and dry.  Psychiatric: She has a normal mood and affect. Her behavior is normal. Judgment and thought content normal.   BP (!) 184/76   Pulse 66   Resp 17   Wt 165 lb (74.8 kg)   BMI 25.84 kg/m   Past Medical History:  Diagnosis Date  . Anxiety   . Arthritis   . Cancer (HCC)    squamous cell skin cancer  . Depression   . GERD (gastroesophageal reflux disease)   . Hypercholesteremia   . Lumbar pain    protrusion and stenosis   Social History   Social History  . Marital status: Married    Spouse name: N/A  . Number of children: N/A  . Years of education: N/A   Occupational History  . Not on file.   Social History Main Topics  . Smoking status: Never Smoker  . Smokeless tobacco: Never Used  . Alcohol use No  . Drug use: No  . Sexual activity: Not on file   Other Topics Concern  . Not on file   Social History Narrative  . No narrative on file   Past Surgical History:  Procedure Laterality Date  . CATARACT EXTRACTION, BILATERAL    . COLONOSCOPY    . DILATION AND CURETTAGE OF UTERUS    . EYE SURGERY    . FRACTURE SURGERY Right    shoulder  . JOINT REPLACEMENT Left  tkr  . KNEE ARTHROPLASTY Left 08/25/2015   Procedure: COMPUTER ASSISTED TOTAL KNEE ARTHROPLASTY;  Surgeon: Dereck Leep, MD;  Location: [facility];  Service: Orthopedics;  Laterality: Left;  . PERIPHERAL VASCULAR CATHETERIZATION N/A 06/14/2016   Procedure: Endovascular Repair/Stent Graft;  Surgeon: Algernon Huxley, MD;  Location: [facility];  Service: Cardiovascular;  Laterality: N/A;   Family History  Problem Relation Age of Onset  . Aortic aneurysm Mother   . Cancer Father     Allergies  Allergen Reactions  . Statins Other (See Comments)    Cramping in her legs and uneasy feeling.      Assessment & Plan:  Patient with recent endovascular AAA repair (06/14/17). Patient has been experiencing lower extremity pain.  She states pain not always associated with activity however activity does worsen it. Describes pain as shooting down back of legs. Patient endorses a history of back pain and neuropathy. She sees a Restaurant manager, fast food for this which she hasn't seen since her AAA repair.  The patient underwent an ABI which showed no significant lower extremity arterial disease (no previous for comparison). Patient denies any rest pain or ulcerations to her lower extremity.  1. AAA (abdominal aortic aneurysm) without rupture (HCC) - Stable Will order EVAR to assess AAA. Very low threshold that endovascular repair is contributing to lower extremity pain - most likely DJD and neuropathic.  - VAS [place] EVAR DUPLEX; Future  2. Chronic low back pain with left-sided sciatica, unspecified back pain laterality - Stable ABI and PE unremarkable. Encouraged patient to return to chiropractor to treat back pain.  3. Hyperlipidemia, unspecified hyperlipidemia type - Stable Encouraged good control as its slows the progression of atherosclerotic disease  Current Outpatient Prescriptions on File Prior to Visit  Medication Sig Dispense Refill  . acetaminophen (TYLENOL) 500 MG tablet Take 500-1,000 mg by mouth every 6 (six) hours as needed (for pain).    . Ascorbic Acid (VITAMIN C) 1000 MG tablet Take 1,000 mg by mouth daily.    Marland Kitchen aspirin EC 81 MG EC tablet Take 1 tablet (81 mg total) by mouth daily. 90 tablet 3  . Biotin 10000 MCG TABS Take 10,000 mcg by mouth daily.    . clopidogrel (PLAVIX) 75 MG tablet Take 1 tablet (75 mg total) by mouth daily. 30 tablet 3  . LORazepam (ATIVAN) 1 MG tablet Take 1 mg by mouth every 12 (twelve) hours as needed for anxiety.    . naproxen sodium (ANAPROX) 220 MG tablet Take 220-440 mg by mouth 2 (two) times daily as needed (for pain.).    Marland Kitchen traMADol (ULTRAM) 50 MG tablet Take 2 tablets (100 mg total) by mouth every 6 (six) hours as needed for moderate pain. 40 tablet 0   No current facility-administered  medications on file prior to visit.     There are no Patient Instructions on file for this visit. No Follow-up on file.   Isolde Skaff A Rizwan Kuyper, PA-C

## 2016-09-11 ENCOUNTER — Other Ambulatory Visit (INDEPENDENT_AMBULATORY_CARE_PROVIDER_SITE_OTHER): Payer: Self-pay | Admitting: Vascular Surgery

## 2016-10-27 ENCOUNTER — Ambulatory Visit (INDEPENDENT_AMBULATORY_CARE_PROVIDER_SITE_OTHER): Payer: Medicare Other | Admitting: Vascular Surgery

## 2016-10-27 ENCOUNTER — Ambulatory Visit (INDEPENDENT_AMBULATORY_CARE_PROVIDER_SITE_OTHER): Payer: Medicare Other

## 2016-10-27 ENCOUNTER — Encounter (INDEPENDENT_AMBULATORY_CARE_PROVIDER_SITE_OTHER): Payer: Self-pay | Admitting: Vascular Surgery

## 2016-10-27 VITALS — BP 162/96 | HR 69 | Resp 16 | Ht 65.0 in | Wt 166.0 lb

## 2016-10-27 DIAGNOSIS — M79609 Pain in unspecified limb: Secondary | ICD-10-CM | POA: Insufficient documentation

## 2016-10-27 DIAGNOSIS — E785 Hyperlipidemia, unspecified: Secondary | ICD-10-CM

## 2016-10-27 DIAGNOSIS — I714 Abdominal aortic aneurysm, without rupture, unspecified: Secondary | ICD-10-CM

## 2016-10-27 DIAGNOSIS — M79605 Pain in left leg: Secondary | ICD-10-CM | POA: Diagnosis not present

## 2016-10-27 NOTE — Assessment & Plan Note (Signed)
Her aortic duplex today shows significant regression of her aortic sac size now measuring 5.6 cm in diameter. This was previously 6 cm in diameter. There is no endoleak present. She is doing well. She can now stop her Plavix just take an 81 mg aspirin daily. I will plan to recheck her in 6 months with duplex.

## 2016-10-27 NOTE — Assessment & Plan Note (Signed)
lipid control important in reducing the progression of atherosclerotic disease.   

## 2016-10-27 NOTE — Progress Notes (Signed)
MRN : [id number]  Molly Fisher is a 83 y.o. ([birth year]) female who presents with chief complaint of  Chief Complaint  Patient presents with  . Re-evaluation    3 month EVAR  .  History of Present Illness: Patient returns today in follow up of AAA. She is about 5 months status post endovascular repair at this point. She is doing well. She does have some intermittent left leg pain. This comes with sitting for long periods of time and really seems to affect her calf and knee area. The pain is a pins and needles sensation that is brief but frequent. She does have some heaviness in the leg and intermittent swelling. She also has some cramping and leg. This is not really related to activity. Her blood pressure has been good and she is status post renal artery stenting as well. Her aortic duplex today shows significant regression of her aortic sac size now measuring 5.6 cm in diameter. This was previously 6 cm in diameter. There is no endoleak present.  Current Outpatient Prescriptions  Medication Sig Dispense Refill  . acetaminophen (TYLENOL) 500 MG tablet Take 500-1,000 mg by mouth every 6 (six) hours as needed (for pain).    . Ascorbic Acid (VITAMIN C) 1000 MG tablet Take 1,000 mg by mouth daily.    Marland Kitchen aspirin EC 81 MG EC tablet Take 1 tablet (81 mg total) by mouth daily. 90 tablet 3  . Biotin 10 MG TABS Take by mouth.    . Biotin 10000 MCG TABS Take 10,000 mcg by mouth daily.    . clopidogrel (PLAVIX) 75 MG tablet TAKE 1 TABLET BY MOUTH ONCE DAILY. 30 tablet 0  . LORazepam (ATIVAN) 1 MG tablet Take 1 mg by mouth every 12 (twelve) hours as needed for anxiety.    . naproxen sodium (ANAPROX) 220 MG tablet Take 220-440 mg by mouth 2 (two) times daily as needed (for pain.).    Marland Kitchen predniSONE (DELTASONE) 10 MG tablet     . traMADol (ULTRAM) 50 MG tablet Take 2 tablets (100 mg total) by mouth every 6 (six) hours as needed for moderate pain. (Patient not taking: Reported on 10/27/2016) 40 tablet 0    No current facility-administered medications for this visit.     Past Medical History:  Diagnosis Date  . Anxiety   . Arthritis   . Cancer (HCC)    squamous cell skin cancer  . Depression   . GERD (gastroesophageal reflux disease)   . Hypercholesteremia   . Lumbar pain    protrusion and stenosis    Past Surgical History:  Procedure Laterality Date  . CATARACT EXTRACTION, BILATERAL    . COLONOSCOPY    . DILATION AND CURETTAGE OF UTERUS    . EYE SURGERY    . FRACTURE SURGERY Right    shoulder  . JOINT REPLACEMENT Left    tkr  . KNEE ARTHROPLASTY Left 08/25/2015   Procedure: COMPUTER ASSISTED TOTAL KNEE ARTHROPLASTY;  Surgeon: Dereck Leep, MD;  Location: [facility];  Service: Orthopedics;  Laterality: Left;  . PERIPHERAL VASCULAR CATHETERIZATION N/A 06/14/2016   Procedure: Endovascular Repair/Stent Graft;  Surgeon: Algernon Huxley, MD;  Location: [facility];  Service: Cardiovascular;  Laterality: N/A;    Social History Social History  Substance Use Topics  . Smoking status: Never Smoker  . Smokeless tobacco: Never Used  . Alcohol use No    Family History Family History  Problem Relation Age of Onset  .  Aortic aneurysm Mother   . Cancer Father   No bleeding or clotting disorders  Allergies  Allergen Reactions  . Statins Other (See Comments)    Cramping in her legs and uneasy feeling.     REVIEW OF SYSTEMS (Negative unless checked)  Constitutional: [] Weight loss  [] Fever  [] Chills Cardiac: [] Chest pain   [] Chest pressure   [] Palpitations   [] Shortness of breath when laying flat   [] Shortness of breath at rest   [] Shortness of breath with exertion. Vascular:  [] Pain in legs with walking   [x] Pain in legs at rest   [] Pain in legs when laying flat   [] Claudication   [] Pain in feet when walking  [] Pain in feet at rest  [] Pain in feet when laying flat   [] History of DVT   [] Phlebitis   [] Swelling in legs   [] Varicose veins   [] Non-healing ulcers Pulmonary:    [] Uses home oxygen   [] Productive cough   [] Hemoptysis   [] Wheeze  [] COPD   [] Asthma Neurologic:  [] Dizziness  [] Blackouts   [] Seizures   [] History of stroke   [] History of TIA  [] Aphasia   [] Temporary blindness   [] Dysphagia   [] Weakness or numbness in arms   [] Weakness or numbness in legs Musculoskeletal:  [x] Arthritis   [] Joint swelling   [] Joint pain   [] Low back pain Hematologic:  [] Easy bruising  [] Easy bleeding   [] Hypercoagulable state   [] Anemic   Gastrointestinal:  [] Blood in stool   [] Vomiting blood  [] Gastroesophageal reflux/heartburn   [] Abdominal pain Genitourinary:  [] Chronic kidney disease   [] Difficult urination  [] Frequent urination  [] Burning with urination   [] Hematuria Skin:  [] Rashes   [] Ulcers   [] Wounds Psychological:  [] History of anxiety   []  History of major depression.  Physical Examination  BP (!) 162/96 (BP Location: Right Arm)   Pulse 69   Resp 16   Ht 5\' 5"  (1.651 m)   Wt 166 lb (75.3 kg)   BMI 27.62 kg/m  Gen:  WD/WN, NAD. Appears younger than stated age Head: [place]/AT, No temporalis wasting. Ear/Nose/Throat: Hearing grossly intact, nares w/o erythema or drainage, trachea midline Eyes: Conjunctiva clear. Sclera non-icteric Neck: Supple.  No JVD.  Pulmonary:  Good air movement, no use of accessory muscles.  Cardiac: RRR, normal S1, S2 Vascular:  Vessel Right Left  Radial Palpable Palpable  Ulnar Palpable Palpable  Brachial Palpable Palpable  Carotid Palpable, without bruit Palpable, without bruit  Aorta Not palpable N/A  Femoral Palpable Palpable  Popliteal Palpable Palpable  PT 1+ Palpable Palpable  DP 1+ Palpable 1+ Palpable   Gastrointestinal: soft, non-tender/non-distended. Aortic impulse does not appear enlarged Musculoskeletal: M/S 5/5 throughout.  No deformity or atrophy. No significant lower extremity edema. Neurologic: Sensation grossly intact in extremities.  Symmetrical.  Speech is fluent.  Psychiatric: Judgment intact, Mood & affect  appropriate for pt's clinical situation. Dermatologic: No rashes or ulcers noted.  No cellulitis or open wounds.       Labs No results found for this or any previous visit (from the past 2160 hour(s)).  Radiology No results found.   Assessment/Plan  Hyperlipidemia lipid control important in reducing the progression of atherosclerotic disease.   Pain in limb The pain does not sound like arterial insufficiency in nature. It is not related to activity. It sounds more like arthritis and neuropathic pain.  AAA (abdominal aortic aneurysm) without rupture (HCC) Her aortic duplex today shows significant regression of her aortic sac size now measuring 5.6 cm in  diameter. This was previously 6 cm in diameter. There is no endoleak present. She is doing well. She can now stop her Plavix just take an 81 mg aspirin daily. I will plan to recheck her in 6 months with duplex.    Leotis Pain, MD  10/27/2016 10:20 AM    This note was created with Dragon medical transcription system.  Any errors from dictation are purely unintentional

## 2016-10-27 NOTE — Assessment & Plan Note (Signed)
The pain does not sound like arterial insufficiency in nature. It is not related to activity. It sounds more like arthritis and neuropathic pain.

## 2017-01-29 ENCOUNTER — Telehealth (INDEPENDENT_AMBULATORY_CARE_PROVIDER_SITE_OTHER): Payer: Self-pay | Admitting: Vascular Surgery

## 2017-01-29 NOTE — Telephone Encounter (Signed)
I spoke with patient and inform her with the information from [place]

## 2017-01-29 NOTE — Telephone Encounter (Signed)
Patient called and stated that she is about to start getting epidural shots in her back for her leg pain by Dr. Westley Gambles. She states that he wants to put her back on Plavix, even though Dew took her off in May. She is also taking 81mg  Aspirin. She is concerned.

## 2017-03-12 ENCOUNTER — Other Ambulatory Visit (INDEPENDENT_AMBULATORY_CARE_PROVIDER_SITE_OTHER): Payer: Self-pay | Admitting: Vascular Surgery

## 2017-05-18 ENCOUNTER — Other Ambulatory Visit (INDEPENDENT_AMBULATORY_CARE_PROVIDER_SITE_OTHER): Payer: Medicare Other

## 2017-05-18 ENCOUNTER — Ambulatory Visit (INDEPENDENT_AMBULATORY_CARE_PROVIDER_SITE_OTHER): Payer: Medicare Other | Admitting: Vascular Surgery

## 2017-06-29 ENCOUNTER — Ambulatory Visit (INDEPENDENT_AMBULATORY_CARE_PROVIDER_SITE_OTHER): Payer: Medicare Other | Admitting: Vascular Surgery

## 2017-06-29 ENCOUNTER — Other Ambulatory Visit (INDEPENDENT_AMBULATORY_CARE_PROVIDER_SITE_OTHER): Payer: Medicare Other

## 2017-07-18 ENCOUNTER — Other Ambulatory Visit (INDEPENDENT_AMBULATORY_CARE_PROVIDER_SITE_OTHER): Payer: Medicare Other

## 2017-07-18 ENCOUNTER — Ambulatory Visit (INDEPENDENT_AMBULATORY_CARE_PROVIDER_SITE_OTHER): Payer: Medicare Other | Admitting: Vascular Surgery

## 2017-08-01 IMAGING — DX DG KNEE 1-2V PORT*L*
2 series · 2 of 2 positions shown · non-contrast
Comparison: None.

CLINICAL DATA: Status post left knee replacement

EXAM:
PORTABLE LEFT KNEE - 1-2 VIEW

[knee ap]
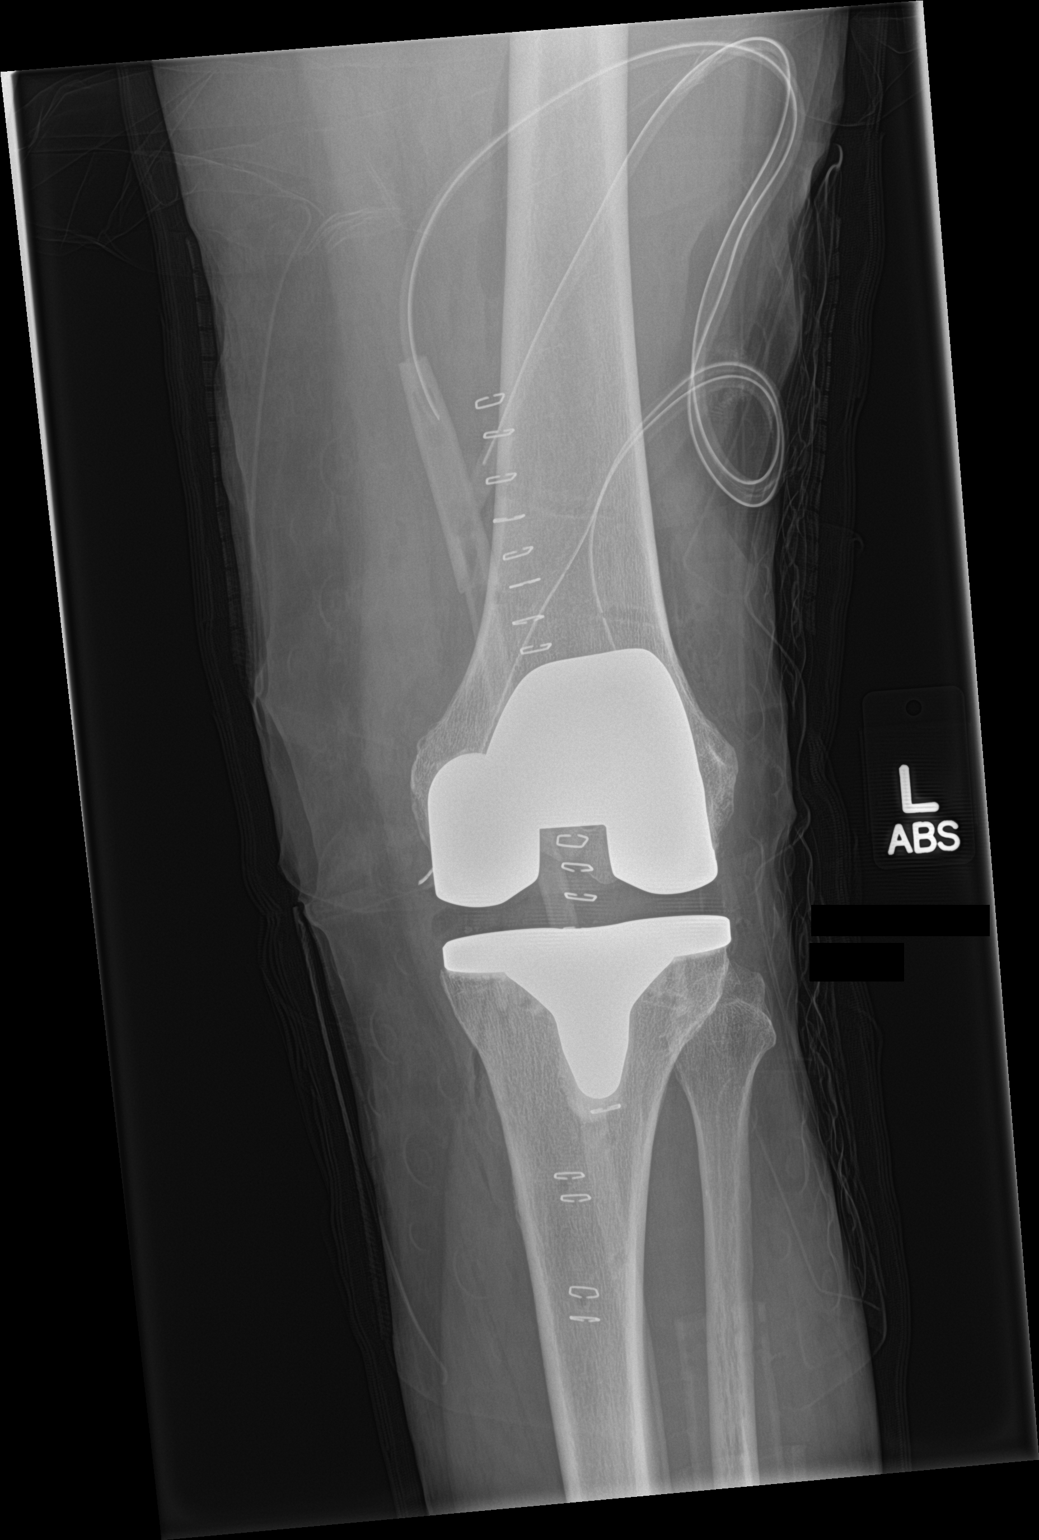

[knee lat]
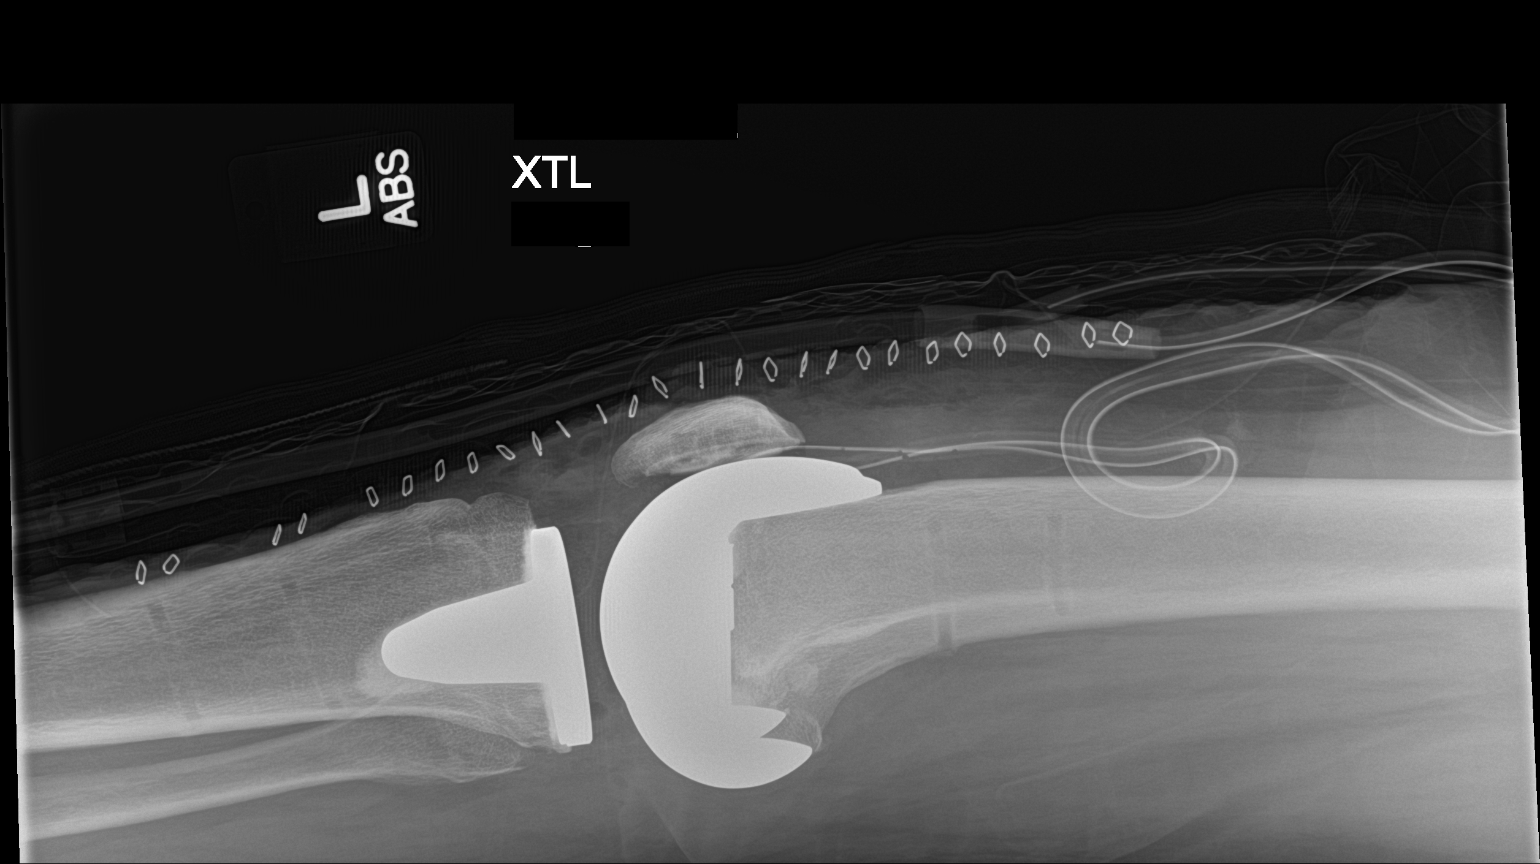

[2 of 2 positions shown; findings below may reference images not displayed]

FINDINGS: Left knee prosthesis is seen. A surgical drain is noted in place. No
acute soft tissue abnormality is noted.
IMPRESSION: Status post left knee replacement.

## 2017-08-08 ENCOUNTER — Ambulatory Visit (INDEPENDENT_AMBULATORY_CARE_PROVIDER_SITE_OTHER): Payer: Medicare Other | Admitting: Vascular Surgery

## 2017-08-08 ENCOUNTER — Other Ambulatory Visit (INDEPENDENT_AMBULATORY_CARE_PROVIDER_SITE_OTHER): Payer: Medicare Other

## 2017-09-05 ENCOUNTER — Ambulatory Visit (INDEPENDENT_AMBULATORY_CARE_PROVIDER_SITE_OTHER): Payer: Medicare Other | Admitting: Vascular Surgery

## 2017-09-05 ENCOUNTER — Encounter (INDEPENDENT_AMBULATORY_CARE_PROVIDER_SITE_OTHER): Payer: Self-pay | Admitting: Vascular Surgery

## 2017-09-05 ENCOUNTER — Ambulatory Visit (INDEPENDENT_AMBULATORY_CARE_PROVIDER_SITE_OTHER): Payer: Medicare Other

## 2017-09-05 VITALS — BP 194/85 | HR 85 | Resp 16 | Ht 67.0 in | Wt 163.0 lb

## 2017-09-05 DIAGNOSIS — I714 Abdominal aortic aneurysm, without rupture, unspecified: Secondary | ICD-10-CM

## 2017-09-05 DIAGNOSIS — E785 Hyperlipidemia, unspecified: Secondary | ICD-10-CM

## 2017-09-05 NOTE — Progress Notes (Signed)
Subjective:    Patient ID: Molly Fisher, female    DOB: [birth year], 84 y.o.   MRN: [id number] Chief Complaint  Patient presents with  . Follow-up  . AAA   Patient presents for a yearly AAA follow-up.  The patient is status post an endovascular repair of a AAA on June 14, 2016.  The patient presents today without complaint.  The patient denies any abdominal pain, back pain or thrombosis to the bilateral lower extremity.  The patient denies any claudication-like symptoms, rest pain or ulceration to the bilateral lower extremity.  The patient underwent a Ivar duplex which was notable for a AAA measuring 4.5 cm.  Patent endovascular aneurysm repair with no evidence of endoleak.  Previous diameter measurement on Oct 27, 2016 was 5.6 cm.  The patient denies any fever, nausea or vomiting.   Review of Systems  Constitutional: Negative.   HENT: Negative.   Eyes: Negative.   Respiratory: Negative.   Cardiovascular: Negative.   Gastrointestinal: Negative.   Endocrine: Negative.   Genitourinary: Negative.   Musculoskeletal: Negative.   Skin: Negative.   Allergic/Immunologic: Negative.   Neurological: Negative.   Hematological: Negative.   Psychiatric/Behavioral: Negative.       Objective:   Physical Exam  Constitutional: She appears well-developed and well-nourished. No distress.  HENT:  Head: Normocephalic and atraumatic.  Eyes: Conjunctivae are normal. Pupils are equal, round, and reactive to light.  Neck: Normal range of motion.  Cardiovascular: Normal rate, regular rhythm, normal heart sounds and intact distal pulses.  Pulses:      Radial pulses are 2+ on the right side, and 2+ on the left side.       Dorsalis pedis pulses are 2+ on the right side, and 2+ on the left side.       Posterior tibial pulses are 2+ on the right side, and 2+ on the left side.  Pulmonary/Chest: Effort normal and breath sounds normal. No respiratory distress. She has no wheezes. She has no rales.    Abdominal: Soft. Bowel sounds are normal. She exhibits no distension. There is no tenderness. There is no rebound.  Musculoskeletal: Normal range of motion. She exhibits no edema.  Neurological: She is alert.  Skin: Skin is warm and dry. She is not diaphoretic.  Psychiatric: She has a normal mood and affect. Her behavior is normal. Judgment and thought content normal.  Vitals reviewed.  BP (!) 194/85   Pulse 85   Resp 16   Ht 5\' 7"  (1.702 m)   Wt 163 lb (73.9 kg)   BMI 25.53 kg/m   Past Medical History:  Diagnosis Date  . Anxiety   . Arthritis   . Cancer (HCC)    squamous cell skin cancer  . Depression   . GERD (gastroesophageal reflux disease)   . Hypercholesteremia   . Lumbar pain    protrusion and stenosis   Social History   Socioeconomic History  . Marital status: Married    Spouse name: Not on file  . Number of children: Not on file  . Years of education: Not on file  . Highest education level: Not on file  Social Needs  . Financial resource strain: Not on file  . Food insecurity - worry: Not on file  . Food insecurity - inability: Not on file  . Transportation needs - medical: Not on file  . Transportation needs - non-medical: Not on file  Occupational History  . Not on file  Tobacco Use  .  Smoking status: Never Smoker  . Smokeless tobacco: Never Used  Substance and Sexual Activity  . Alcohol use: No  . Drug use: No  . Sexual activity: Not on file  Other Topics Concern  . Not on file  Social History Narrative  . Not on file   Past Surgical History:  Procedure Laterality Date  . CATARACT EXTRACTION, BILATERAL    . COLONOSCOPY    . DILATION AND CURETTAGE OF UTERUS    . EYE SURGERY    . FRACTURE SURGERY Right    shoulder  . JOINT REPLACEMENT Left    tkr  . KNEE ARTHROPLASTY Left 08/25/2015   Procedure: COMPUTER ASSISTED TOTAL KNEE ARTHROPLASTY;  Surgeon: Dereck Leep, MD;  Location: [facility];  Service: Orthopedics;  Laterality: Left;  .  PERIPHERAL VASCULAR CATHETERIZATION N/A 06/14/2016   Procedure: Endovascular Repair/Stent Graft;  Surgeon: Algernon Huxley, MD;  Location: [facility];  Service: Cardiovascular;  Laterality: N/A;   Family History  Problem Relation Age of Onset  . Aortic aneurysm Mother   . Cancer Father    Allergies  Allergen Reactions  . Statins Other (See Comments)    Cramping in her legs and uneasy feeling.      Assessment & Plan:  Patient presents for a six-month AAA follow-up.  The patient is status post an endovascular repair of a AAA on June 14, 2016.  The patient presents today without complaint.  The patient denies any abdominal pain, back pain or thrombosis to the bilateral lower extremity.  The patient denies any claudication-like symptoms, rest pain or ulceration to the bilateral lower extremity.  The patient underwent a Ivar duplex which was notable for a AAA measuring 4.5 cm.  Patent endovascular aneurysm repair with no evidence of endoleak.  Previous diameter measurement on Oct 27, 2016 was 5.6 cm.  The patient denies any fever, nausea or vomiting.  1. AAA (abdominal aortic aneurysm) without rupture ([place]) - Stable Patient presents for a six-month AAA follow-up The patient is status post endovascular repair for AAA on June 14, 2016 The patient presents today without complaint Physical exam is unremarkable EVAR duplex with a patent endovascular stent. No endoleak. No indication for intervention at this time The patient is to follow-up in 1 year for surveillance EVAR and ABI  - VAS [place] EVAR DUPLEX; Future - VAS [place] ABI WITH/WO TBI; Future  2. Hyperlipidemia, unspecified hyperlipidemia type - Stable Encouraged good control as its slows the progression of atherosclerotic disease  Current Outpatient Medications on File Prior to Visit  Medication Sig Dispense Refill  . acetaminophen (TYLENOL) 500 MG tablet Take 500-1,000 mg by mouth every 6 (six) hours as needed (for pain).    .  Biotin 10000 MCG TABS Take 10,000 mcg by mouth daily.    Marland Kitchen co-enzyme Q-10 30 MG capsule Take 60 mg by mouth 3 (three) times daily.    . GNP ASPIRIN LOW DOSE 81 MG EC tablet TAKE 1 TABLET BY MOUTH ONCE DAILY. 90 tablet 3  . LORazepam (ATIVAN) 1 MG tablet Take 1 mg by mouth every 12 (twelve) hours as needed for anxiety.    . rosuvastatin (CRESTOR) 10 MG tablet Take 10 mg by mouth daily.    . traMADol (ULTRAM) 50 MG tablet Take 2 tablets (100 mg total) by mouth every 6 (six) hours as needed for moderate pain. 40 tablet 0  . Ascorbic Acid (VITAMIN C) 1000 MG tablet Take 1,000 mg by mouth daily.    Marland Kitchen  Biotin 10 MG TABS Take by mouth.    . clopidogrel (PLAVIX) 75 MG tablet TAKE 1 TABLET BY MOUTH ONCE DAILY. (Patient not taking: Reported on 09/05/2017) 30 tablet 0  . naproxen sodium (ANAPROX) 220 MG tablet Take 220-440 mg by mouth 2 (two) times daily as needed (for pain.).    Marland Kitchen predniSONE (DELTASONE) 10 MG tablet      No current facility-administered medications on file prior to visit.    There are no Patient Instructions on file for this visit. No Follow-up on file.  Terris Germano A Leydi Winstead, PA-C

## 2018-09-06 ENCOUNTER — Ambulatory Visit (INDEPENDENT_AMBULATORY_CARE_PROVIDER_SITE_OTHER): Payer: Medicare Other | Admitting: Vascular Surgery

## 2018-09-06 ENCOUNTER — Other Ambulatory Visit (INDEPENDENT_AMBULATORY_CARE_PROVIDER_SITE_OTHER): Payer: Medicare Other

## 2018-09-06 ENCOUNTER — Encounter (INDEPENDENT_AMBULATORY_CARE_PROVIDER_SITE_OTHER): Payer: Medicare Other

## 2018-09-17 ENCOUNTER — Ambulatory Visit (INDEPENDENT_AMBULATORY_CARE_PROVIDER_SITE_OTHER): Payer: Medicare Other | Admitting: Vascular Surgery

## 2018-09-17 ENCOUNTER — Other Ambulatory Visit (INDEPENDENT_AMBULATORY_CARE_PROVIDER_SITE_OTHER): Payer: Medicare Other

## 2018-09-17 ENCOUNTER — Encounter (INDEPENDENT_AMBULATORY_CARE_PROVIDER_SITE_OTHER): Payer: Medicare Other

## 2018-11-08 ENCOUNTER — Ambulatory Visit (INDEPENDENT_AMBULATORY_CARE_PROVIDER_SITE_OTHER): Payer: Medicare Other | Admitting: Vascular Surgery

## 2018-11-08 ENCOUNTER — Other Ambulatory Visit (INDEPENDENT_AMBULATORY_CARE_PROVIDER_SITE_OTHER): Payer: Medicare Other

## 2018-11-08 ENCOUNTER — Encounter (INDEPENDENT_AMBULATORY_CARE_PROVIDER_SITE_OTHER): Payer: Medicare Other

## 2020-08-31 ENCOUNTER — Telehealth (INDEPENDENT_AMBULATORY_CARE_PROVIDER_SITE_OTHER): Payer: Self-pay | Admitting: Vascular Surgery

## 2020-08-31 NOTE — Telephone Encounter (Signed)
Lets reschedule patient missed appointment and follow up with Dew or Arna Medici

## 2020-08-31 NOTE — Telephone Encounter (Signed)
Patient called in needing some direction if she needs to make any follow up appointments with Dr. Lucky Cowboy.  Patient had surgery to correct an aneurysm back in 2017 and have had regular visits since 2019.  Patient states when covid hit she lost all of her appointments.  Since then, she hasn't had any issues since the surgery but wants reassurance if she needs to start them back?

## 2020-11-01 ENCOUNTER — Other Ambulatory Visit (INDEPENDENT_AMBULATORY_CARE_PROVIDER_SITE_OTHER): Payer: Self-pay | Admitting: Vascular Surgery

## 2020-11-01 DIAGNOSIS — I714 Abdominal aortic aneurysm, without rupture, unspecified: Secondary | ICD-10-CM

## 2020-11-01 DIAGNOSIS — Z9889 Other specified postprocedural states: Secondary | ICD-10-CM

## 2020-11-02 ENCOUNTER — Encounter (INDEPENDENT_AMBULATORY_CARE_PROVIDER_SITE_OTHER): Payer: Self-pay | Admitting: Vascular Surgery

## 2020-11-02 ENCOUNTER — Ambulatory Visit (INDEPENDENT_AMBULATORY_CARE_PROVIDER_SITE_OTHER): Payer: Medicare Other | Admitting: Vascular Surgery

## 2020-11-02 ENCOUNTER — Encounter (INDEPENDENT_AMBULATORY_CARE_PROVIDER_SITE_OTHER): Payer: Medicare Other

## 2020-11-02 ENCOUNTER — Other Ambulatory Visit (INDEPENDENT_AMBULATORY_CARE_PROVIDER_SITE_OTHER): Payer: Medicare Other

## 2021-01-25 ENCOUNTER — Telehealth (INDEPENDENT_AMBULATORY_CARE_PROVIDER_SITE_OTHER): Payer: Self-pay | Admitting: Vascular Surgery

## 2021-01-25 NOTE — Telephone Encounter (Signed)
Called on 01/11/21 to get information on a bill she received for 50.00. I emailed admin to get more informoation for patient. Admin emalied billing to make them aware. Billing for some reason refunded the 50.00 payment back to patient. I made patient aware that payment she mailed out on 11/07/20 was refunded back to her reasons why she received a bill for 50.00. Patient called again today and spoke with Seth Bake about the bill.

## 2021-05-31 ENCOUNTER — Other Ambulatory Visit (INDEPENDENT_AMBULATORY_CARE_PROVIDER_SITE_OTHER): Payer: Medicare Other

## 2021-05-31 ENCOUNTER — Ambulatory Visit (INDEPENDENT_AMBULATORY_CARE_PROVIDER_SITE_OTHER): Payer: Medicare Other | Admitting: Vascular Surgery

## 2021-05-31 ENCOUNTER — Encounter (INDEPENDENT_AMBULATORY_CARE_PROVIDER_SITE_OTHER): Payer: Medicare Other

## 2021-07-05 ENCOUNTER — Other Ambulatory Visit (INDEPENDENT_AMBULATORY_CARE_PROVIDER_SITE_OTHER): Payer: Medicare Other

## 2021-07-05 ENCOUNTER — Encounter (INDEPENDENT_AMBULATORY_CARE_PROVIDER_SITE_OTHER): Payer: Medicare Other

## 2021-07-05 ENCOUNTER — Ambulatory Visit (INDEPENDENT_AMBULATORY_CARE_PROVIDER_SITE_OTHER): Payer: Medicare Other | Admitting: Vascular Surgery

## 2021-07-22 ENCOUNTER — Encounter (INDEPENDENT_AMBULATORY_CARE_PROVIDER_SITE_OTHER): Payer: Self-pay | Admitting: Vascular Surgery

## 2021-07-22 ENCOUNTER — Ambulatory Visit (INDEPENDENT_AMBULATORY_CARE_PROVIDER_SITE_OTHER): Payer: Medicare Other

## 2021-07-22 ENCOUNTER — Ambulatory Visit (INDEPENDENT_AMBULATORY_CARE_PROVIDER_SITE_OTHER): Payer: Medicare Other | Admitting: Vascular Surgery

## 2021-07-22 ENCOUNTER — Other Ambulatory Visit: Payer: Self-pay

## 2021-07-22 VITALS — BP 160/71 | HR 61 | Resp 16 | Wt 160.0 lb

## 2021-07-22 DIAGNOSIS — I7143 Infrarenal abdominal aortic aneurysm, without rupture: Secondary | ICD-10-CM

## 2021-07-22 DIAGNOSIS — I714 Abdominal aortic aneurysm, without rupture, unspecified: Secondary | ICD-10-CM

## 2021-07-22 DIAGNOSIS — M79605 Pain in left leg: Secondary | ICD-10-CM

## 2021-07-22 DIAGNOSIS — Z9889 Other specified postprocedural states: Secondary | ICD-10-CM

## 2021-07-22 DIAGNOSIS — E785 Hyperlipidemia, unspecified: Secondary | ICD-10-CM | POA: Diagnosis not present

## 2021-07-22 NOTE — Progress Notes (Signed)
MRN : [id number]  Molly Fisher is a 88 y.o. (August 06, 1930) female who presents with chief complaint of  Chief Complaint  Patient presents with   Follow-up    Ultrasound follow up  .  History of Present Illness: Patient returns today in follow up of her AAA and leg pain.  She is a little over 5 years status post a repair of a complex juxtarenal abdominal aortic aneurysm with endovascular procedures including bilateral renal snorkels to get a proximal seal.  She initially did well and was seen for roughly a year postoperatively, but then COVID hit and she missed her appointment in 2020 and has not been back since.  She still has some pain in her legs particularly on the left both with activity and with rest.  She is not having significant abdominal pain, back pain, or signs of peripheral embolization.  Her noninvasive study today was quite significant for a marked increase in the aortic diameter now measuring 9.4 cm in maximal diameter.  There does appear to be an endoleak within the sac. Her biggest complaint currently is of leg pain.  This is more on the left than the right.  No open wounds or infection.  Does not sound like ischemic rest pain.  To evaluate for potential vascular cause of her pain, noninvasive studies were performed today. ABIs were done today with a right ABI of 1.09 and a left ABI of 0.99 with triphasic waveforms and normal digital pressures consistent with no arterial insufficiency.  Current Outpatient Medications  Medication Sig Dispense Refill   acetaminophen (TYLENOL) 500 MG tablet Take 500-1,000 mg by mouth every 6 (six) hours as needed (for pain).     Ascorbic Acid (VITAMIN C) 1000 MG tablet Take 1,000 mg by mouth daily.     Biotin 10 MG TABS Take by mouth.     Biotin 10000 MCG TABS Take 10,000 mcg by mouth daily.     Cholecalciferol (D3-1000) 25 MCG (1000 UT) capsule Take 1,000 Units by mouth daily.     escitalopram (LEXAPRO) 10 MG tablet Take 10 mg by mouth daily.      gabapentin (NEURONTIN) 100 MG capsule Take 100 mg by mouth daily.     GNP ASPIRIN LOW DOSE 81 MG EC tablet TAKE 1 TABLET BY MOUTH ONCE DAILY. 90 tablet 3   rosuvastatin (CRESTOR) 10 MG tablet Take 10 mg by mouth daily.     solifenacin (VESICARE) 5 MG tablet Take by mouth.     vitamin B-12 (CYANOCOBALAMIN) 1000 MCG tablet Take 1,000 mcg by mouth daily.     clopidogrel (PLAVIX) 75 MG tablet TAKE 1 TABLET BY MOUTH ONCE DAILY. (Patient not taking: No sig reported) 30 tablet 0   co-enzyme Q-10 30 MG capsule Take 60 mg by mouth 3 (three) times daily. (Patient not taking: Reported on 07/22/2021)     LORazepam (ATIVAN) 1 MG tablet Take 1 mg by mouth every 12 (twelve) hours as needed for anxiety. (Patient not taking: Reported on 07/22/2021)     naproxen sodium (ANAPROX) 220 MG tablet Take 220-440 mg by mouth 2 (two) times daily as needed (for pain.). (Patient not taking: Reported on 07/22/2021)     predniSONE (DELTASONE) 10 MG tablet  (Patient not taking: Reported on 07/22/2021)     traMADol (ULTRAM) 50 MG tablet Take 2 tablets (100 mg total) by mouth every 6 (six) hours as needed for moderate pain. (Patient not taking: Reported on 07/22/2021) 40 tablet 0   No  current facility-administered medications for this visit.    Past Medical History:  Diagnosis Date   Anxiety    Arthritis    Cancer ([place])    squamous cell skin cancer   Depression    GERD (gastroesophageal reflux disease)    Hypercholesteremia    Lumbar pain    protrusion and stenosis    Past Surgical History:  Procedure Laterality Date   CATARACT EXTRACTION, BILATERAL     COLONOSCOPY     DILATION AND CURETTAGE OF UTERUS     EYE SURGERY     FRACTURE SURGERY Right    shoulder   JOINT REPLACEMENT Left    tkr   KNEE ARTHROPLASTY Left 08/25/2015   Procedure: COMPUTER ASSISTED TOTAL KNEE ARTHROPLASTY;  Surgeon: Dereck Leep, MD;  Location: [facility];  Service: Orthopedics;  Laterality: Left;   PERIPHERAL VASCULAR CATHETERIZATION N/A  06/14/2016   Procedure: Endovascular Repair/Stent Graft;  Surgeon: Algernon Huxley, MD;  Location: [facility];  Service: Cardiovascular;  Laterality: N/A;     Social History   Tobacco Use   Smoking status: Never   Smokeless tobacco: Never  Substance Use Topics   Alcohol use: No   Drug use: No      Family History  Problem Relation Age of Onset   Aortic aneurysm Mother    Cancer Father   No bleeding or clotting disorders  Allergies  Allergen Reactions   Statins Other (See Comments)    Cramping in her legs and uneasy feeling.     REVIEW OF SYSTEMS (Negative unless checked)  Constitutional: [] Weight loss  [] Fever  [] Chills Cardiac: [] Chest pain   [] Chest pressure   [] Palpitations   [] Shortness of breath when laying flat   [] Shortness of breath at rest   [] Shortness of breath with exertion. Vascular:  [] Pain in legs with walking   [x] Pain in legs at rest   [] Pain in legs when laying flat   [] Claudication   [] Pain in feet when walking  [] Pain in feet at rest  [] Pain in feet when laying flat   [] History of DVT   [] Phlebitis   [] Swelling in legs   [] Varicose veins   [] Non-healing ulcers Pulmonary:   [] Uses home oxygen   [] Productive cough   [] Hemoptysis   [] Wheeze  [] COPD   [] Asthma Neurologic:  [] Dizziness  [] Blackouts   [] Seizures   [] History of stroke   [] History of TIA  [] Aphasia   [] Temporary blindness   [] Dysphagia   [] Weakness or numbness in arms   [] Weakness or numbness in legs Musculoskeletal:  [x] Arthritis   [] Joint swelling   [] Joint pain   [] Low back pain Hematologic:  [] Easy bruising  [] Easy bleeding   [] Hypercoagulable state   [] Anemic   Gastrointestinal:  [] Blood in stool   [] Vomiting blood  [] Gastroesophageal reflux/heartburn   [] Abdominal pain Genitourinary:  [] Chronic kidney disease   [] Difficult urination  [] Frequent urination  [] Burning with urination   [] Hematuria Skin:  [] Rashes   [] Ulcers   [] Wounds Psychological:  [] History of anxiety   []  History of  major depression.  Physical Examination  BP (!) 160/71 (BP Location: Right Arm)    Pulse 61    Resp 16    Wt 160 lb (72.6 kg)    BMI 25.06 kg/m  Gen:  WD/WN, NAD.  Appears younger than stated age Head: [place]/AT, No temporalis wasting. Ear/Nose/Throat: Hearing grossly intact, nares w/o erythema or drainage Eyes: Conjunctiva clear. Sclera non-icteric Neck: Supple.  Trachea midline Pulmonary:  Good air movement, no use of accessory muscles.  Cardiac: RRR, no JVD Vascular:  Vessel Right Left  Radial Palpable Palpable                          PT Palpable Palpable  DP Palpable Palpable   Gastrointestinal: soft, non-tender/non-distended. No guarding/reflex.  Musculoskeletal: M/S 5/5 throughout.  No deformity or atrophy.  No significant lower extremity edema. Neurologic: Sensation grossly intact in extremities.  Symmetrical.  Speech is fluent.  Psychiatric: Judgment intact, Mood & affect appropriate for pt's clinical situation. Dermatologic: No rashes or ulcers noted.  No cellulitis or open wounds.      Labs No results found for this or any previous visit (from the past 2160 hour(s)).  Radiology No results found.  Assessment/Plan Hyperlipidemia lipid control important in reducing the progression of atherosclerotic disease.   AAA (abdominal aortic aneurysm) without rupture (HCC) Her noninvasive study today was quite significant for a marked increase in the aortic diameter now measuring 9.4 cm in maximal diameter.  There does appear to be an endoleak within the sac.  This is a very alarming finding and given the complex anatomy at the time of her initial repair, I am very suspicious of a type Ia endoleak proximally.  We will get a CT angiogram ASAP to evaluate this aneurysm and determine what treatment options may be available.  Given her advanced age and the already complex nature of the aneurysm, this is a very difficult and worrisome situation.  Pain in limb ABIs were done  today with a right ABI of 1.09 and a left ABI of 0.99 with triphasic waveforms and normal digital pressures consistent with no arterial insufficiency.  It does not appear as if malperfusion is the cause of her lower extremity pain.  Ay and demonstrate lower extremity perfusion    Leotis Pain, MD  07/22/2021 10:38 AM    This note was created with Dragon medical transcription system.  Any errors from dictation are purely unintentional

## 2021-07-22 NOTE — Patient Instructions (Signed)
Abdominal Aortic Aneurysm  An abdominal aortic aneurysm (AAA) is an aneurysm that occurs in the lower part of the aorta. The aorta is the main artery of the body, and it supplies blood from the heart to the rest of the body. An aneurysm is a bulge in an artery. Ananeurysm happens when blood pushes against a weakened or damaged artery wall. Most aneurysms do not cause symptoms, but some do cause problems. An AAA can cause two serious problems: It can enlarge and burst. It can cause blood to flow between the layers of the wall of the aorta through a tear (aortic dissection). These problems are medical emergencies. They can cause bleeding inside the body. If they are not diagnosed and treated right away, they can belife-threatening. What are the causes? The exact cause of this condition is not known. What increases the risk? The following factors may make you more likely to develop this condition: Being female and 86 years of age or older. Being of [place] descent. Using or having used nicotine or tobacco products. Having a family history of aneurysms. Having any of these conditions: Hardening of your arteries (arteriosclerosis). Inflammation of the walls of an artery (arteritis). Certain genetic conditions. Obesity. An infection in the wall of your aorta (infectious aortitis) caused by bacteria. High cholesterol. High blood pressure (hypertension). What are the signs or symptoms? Symptoms of this condition vary depending on the size of your aneurysm and how fast it is growing. Most aneurysms grow slowly and do not cause symptoms. When symptoms do occur, they may include: Severe pain in your abdomen, side, or lower back. Feeling full after eating only small amounts of food. Feeling a throbbing lump in your abdomen. Painful feet or toes, or discolored skin or sores on feet or toes. Constipation or trouble urinating. Symptoms of an AAA that has burst include: Severe pain in your  abdomen, side, or back that comes on suddenly. Nausea or vomiting. Feeling light-headed or fainting. How is this diagnosed? This condition may be diagnosed with: A physical exam to check for throbbing and to listen to blood flow in your abdomen. Tests, such as: Ultrasound. X-rays. CT scan. MRI. Angiograms. These tests check your arteries for damage or blockage. Because most AAAs that have not burst do not cause symptoms, they are oftenfound during exams for other conditions. How is this treated? Treatment for this condition depends on: The size of your aneurysm. How fast your aneurysm is growing. Your age. Risk factors for a burst AAA. If your aneurysm is smaller than 2 inches (5 cm), your health care provider may: Monitor it regularly to see if it is getting bigger. Depending on the size of the aneurysm, how fast it is growing, and your other risk factors, you may have an ultrasound to monitor it every 3-6 months, every year, or every few years. Give you medicines to control blood pressure, treat pain, or fight infection. If your aneurysm is larger than 2 inches (5 cm), your health care provider mayrepair it with surgery. Follow these instructions at home: Eating and drinking  Eat a heart-healthy diet. This includes plenty of fresh fruits and vegetables, whole grains, low-fat (lean) protein, and low-fat dairy products. Avoid foods that are high in saturated fat and cholesterol, such as red meat and some dairy products.  Lifestyle     Do not use any products that contain nicotine or tobacco, such as cigarettes, e-cigarettes, and chewing tobacco. If you need help quitting, ask your health care provider.   Stay physically active and exercise regularly. Talk with your health care provider about how often to exercise and which types of exercise are safe for you. Maintain a healthy weight. Alcohol use Do not drink alcohol if: Your health care provider tells you not to drink. You are  pregnant, may be pregnant, or are planning to become pregnant. If you drink alcohol: Limit how much you use to: 0-1 drink a day for women. 0-2 drinks a day for men. Be aware of how much alcohol is in your drink. In the [place] one drink equals one 12 oz bottle of beer (355 mL), one 5 oz glass of wine (148 mL), or one 1 oz glass of hard liquor (44 mL). General instructions Take over-the-counter and prescription medicines only as told by your health care provider. Keep your blood pressure within a normal range. Check it regularly, and ask your health care provider what your target blood pressure should be. Have your blood sugar (glucose) level and cholesterol levels checked regularly. Follow instructions on how to keep levels within normal limits. Avoid heavy lifting and activities that take a lot of effort. Ask what activities are safe for you. If you can, learn your family's health history. Keep all follow-up visits as told by your health care provider. This is important. Contact a health care provider if you have: Pain in your abdomen, side, or back. Throbbing in your abdomen. A fever. Get help right away if: You have sudden, severe pain in your abdomen, side, or back. You experience nausea or vomiting. You feel light-headed or you faint. Your heart beats fast when you stand. You have sweaty, clammy skin. You have shortness of breath. You have constipation or trouble urinating. These symptoms may represent a serious problem that is an emergency. Do not wait to see if the symptoms will go away. Get medical help right away. Call your local emergency services (911 in the [place] Do not drive yourself to the hospital. Summary An aneurysm is a bulge in an artery. An abdominal aortic aneurysm (AAA) is an aneurysm in the lower part of the aorta. An AAA can cause bleeding inside the body, and it can be life-threatening. Risk can increase if you are female, age 60 or older, and of [place]  descent, or if you have used nicotine or tobacco products and have a family history of aneurysms. Get help right away if you have symptoms of a burst AAA. This information is not intended to replace advice given to you by your health care provider. Make sure you discuss any questions you have with your healthcare provider. Document Revised: 03/21/2019 Document Reviewed: 03/21/2019 Elsevier Patient Education  2022 [employer] Inc.  

## 2021-07-22 NOTE — Assessment & Plan Note (Signed)
ABIs were done today with a right ABI of 1.09 and a left ABI of 0.99 with triphasic waveforms and normal digital pressures consistent with no arterial insufficiency.  It does not appear as if malperfusion is the cause of her lower extremity pain.  Ay and demonstrate lower extremity perfusion

## 2021-07-22 NOTE — Assessment & Plan Note (Signed)
Her noninvasive study today was quite significant for a marked increase in the aortic diameter now measuring 9.4 cm in maximal diameter.  There does appear to be an endoleak within the sac.  This is a very alarming finding and given the complex anatomy at the time of her initial repair, I am very suspicious of a type Ia endoleak proximally.  We will get a CT angiogram ASAP to evaluate this aneurysm and determine what treatment options may be available.  Given her advanced age and the already complex nature of the aneurysm, this is a very difficult and worrisome situation.

## 2021-07-25 ENCOUNTER — Telehealth (INDEPENDENT_AMBULATORY_CARE_PROVIDER_SITE_OTHER): Payer: Self-pay | Admitting: *Deleted

## 2021-07-25 NOTE — Telephone Encounter (Signed)
Per patient called granddaughter Larene Beach to advise of appointment for CT's Dr Lucky Cowboy wanted scheduled for the patient. I had to leave a message for her to call the office so we can get it scheduled.  Will call back tomorrow if she does not call back today.

## 2021-07-27 ENCOUNTER — Ambulatory Visit
Admission: RE | Admit: 2021-07-27 | Discharge: 2021-07-27 | Disposition: A | Discharge status: Home / Self Care | Payer: Medicare Other | Source: Ambulatory Visit | Attending: Vascular Surgery | Admitting: Vascular Surgery

## 2021-07-27 ENCOUNTER — Other Ambulatory Visit: Payer: Self-pay

## 2021-07-27 ENCOUNTER — Other Ambulatory Visit (INDEPENDENT_AMBULATORY_CARE_PROVIDER_SITE_OTHER): Payer: Self-pay | Admitting: Nurse Practitioner

## 2021-07-27 DIAGNOSIS — I7143 Infrarenal abdominal aortic aneurysm, without rupture: Secondary | ICD-10-CM | POA: Diagnosis present

## 2021-07-27 LAB — POCT I-STAT CREATININE: Creatinine, Ser: 1.1 mg/dL — ABNORMAL HIGH (ref 0.44–1.00)

## 2021-07-27 MED ORDER — IOHEXOL 350 MG/ML SOLN
80.0000 mL | Freq: Once | INTRAVENOUS | Status: AC | PRN
  Administered 2021-07-27: 80 mL via INTRAVENOUS

## 2021-07-29 ENCOUNTER — Ambulatory Visit (INDEPENDENT_AMBULATORY_CARE_PROVIDER_SITE_OTHER): Payer: Medicare Other | Admitting: Vascular Surgery

## 2021-08-02 ENCOUNTER — Encounter (INDEPENDENT_AMBULATORY_CARE_PROVIDER_SITE_OTHER): Payer: Self-pay | Admitting: Vascular Surgery

## 2021-08-02 ENCOUNTER — Other Ambulatory Visit: Payer: Self-pay

## 2021-08-02 ENCOUNTER — Ambulatory Visit (INDEPENDENT_AMBULATORY_CARE_PROVIDER_SITE_OTHER): Payer: Medicare Other | Admitting: Vascular Surgery

## 2021-08-02 VITALS — BP 145/77 | HR 63 | Resp 16 | Wt 160.8 lb

## 2021-08-02 DIAGNOSIS — E785 Hyperlipidemia, unspecified: Secondary | ICD-10-CM | POA: Diagnosis not present

## 2021-08-02 DIAGNOSIS — M79605 Pain in left leg: Secondary | ICD-10-CM | POA: Diagnosis not present

## 2021-08-02 DIAGNOSIS — I7143 Infrarenal abdominal aortic aneurysm, without rupture: Secondary | ICD-10-CM | POA: Diagnosis not present

## 2021-08-02 NOTE — H&P (View-Only) (Signed)
MRN : [id number]  Molly Fisher is a 88 y.o. ([birth year]) female who presents with chief complaint of  Chief Complaint  Patient presents with   Follow-up    Ct results  .  History of Present Illness: Patient returns today in follow up of her abdominal aortic aneurysm.  She was seen earlier this month after having been lost to follow-up for about 3 years.  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  Her initial duplex showed significant sac shrinkage, but on a duplex earlier this month the aneurysm sac had dramatically enlarged.  She remains asymptomatic from symptoms of an aneurysm. Specifically, the patient denies new back or abdominal pain, or signs of peripheral embolization The patient has undergone CT angiogram of the abdomen pelvis which I have independently reviewed.  This demonstrates an approximately 8.7 cm aneurysm sac around a patent stent graft.  There is a clear endoleak on the left posterior lateral area just below the native renal arteries.  She had a bilateral snorkel procedure and there appears to be about 2 cm of stent graft seal above this area but this is certainly a possibility for a type Ia endoleak from a pleat or a gutter and a snorkel procedure.  That would be a little less likely given the fact she initially had some regression of the sac but that is certainly a possibility.  The possibility was raised of a type III endoleak although this seems a little bit high for this.  I think it is also a significant possibility that this is a large lumbar artery and this is a type II endoleak.   Current Outpatient Medications  Medication Sig Dispense Refill   acetaminophen (TYLENOL) 500 MG tablet Take 500-1,000 mg by mouth every 6 (six) hours as needed (for pain).     Ascorbic Acid (VITAMIN C) 1000 MG tablet Take 1,000 mg by mouth daily.     Biotin 10 MG TABS Take by mouth.      Biotin 10000 MCG TABS Take 10,000 mcg by mouth daily.     Cholecalciferol (D3-1000) 25 MCG (1000 UT) capsule Take 1,000 Units by mouth daily.     escitalopram (LEXAPRO) 10 MG tablet Take 10 mg by mouth daily.     gabapentin (NEURONTIN) 100 MG capsule Take 100 mg by mouth daily.     GNP ASPIRIN LOW DOSE 81 MG EC tablet TAKE 1 TABLET BY MOUTH ONCE DAILY. 90 tablet 3   rosuvastatin (CRESTOR) 10 MG tablet Take 10 mg by mouth daily.     solifenacin (VESICARE) 5 MG tablet Take by mouth.     vitamin B-12 (CYANOCOBALAMIN) 1000 MCG tablet Take 1,000 mcg by mouth daily.     clopidogrel (PLAVIX) 75 MG tablet TAKE 1 TABLET BY MOUTH ONCE DAILY. (Patient not taking: No sig reported) 30 tablet 0   co-enzyme Q-10 30 MG capsule Take 60 mg by mouth 3 (three) times daily. (Patient not taking: Reported on 07/22/2021)     LORazepam (ATIVAN) 1 MG tablet Take 1 mg by mouth every 12 (twelve) hours as needed for anxiety. (Patient not taking: Reported on 07/22/2021)     naproxen sodium (ANAPROX) 220 MG tablet Take 220-440 mg by mouth 2 (two) times daily as needed (for pain.). (Patient not taking: Reported on 07/22/2021)     predniSONE (DELTASONE) 10 MG tablet  (Patient not taking: Reported on 07/22/2021)  traMADol (ULTRAM) 50 MG tablet Take 2 tablets (100 mg total) by mouth every 6 (six) hours as needed for moderate pain. (Patient not taking: Reported on 07/22/2021) 40 tablet 0   No current facility-administered medications for this visit.    Past Medical History:  Diagnosis Date   Anxiety    Arthritis    Cancer ([place])    squamous cell skin cancer   Depression    GERD (gastroesophageal reflux disease)    Hypercholesteremia    Lumbar pain    protrusion and stenosis    Past Surgical History:  Procedure Laterality Date   CATARACT EXTRACTION, BILATERAL     COLONOSCOPY     DILATION AND CURETTAGE OF UTERUS     EYE SURGERY     FRACTURE SURGERY Right    shoulder   JOINT REPLACEMENT Left    tkr   KNEE ARTHROPLASTY  Left 08/25/2015   Procedure: COMPUTER ASSISTED TOTAL KNEE ARTHROPLASTY;  Surgeon: Dereck Leep, MD;  Location: [facility];  Service: Orthopedics;  Laterality: Left;   PERIPHERAL VASCULAR CATHETERIZATION N/A 06/14/2016   Procedure: Endovascular Repair/Stent Graft;  Surgeon: Algernon Huxley, MD;  Location: [facility];  Service: Cardiovascular;  Laterality: N/A;    Social History        Tobacco Use   Smoking status: Never   Smokeless tobacco: Never  Substance Use Topics   Alcohol use: No   Drug use: No               Family History  Problem Relation Age of Onset   Aortic aneurysm Mother     Cancer Father    No bleeding or clotting disorders        Allergies  Allergen Reactions   Statins Other (See Comments)      Cramping in her legs and uneasy feeling.        REVIEW OF SYSTEMS (Negative unless checked)   Constitutional: [] Weight loss  [] Fever  [] Chills Cardiac: [] Chest pain   [] Chest pressure   [] Palpitations   [] Shortness of breath when laying flat   [] Shortness of breath at rest   [] Shortness of breath with exertion. Vascular:  [] Pain in legs with walking   [x] Pain in legs at rest   [] Pain in legs when laying flat   [] Claudication   [] Pain in feet when walking  [] Pain in feet at rest  [] Pain in feet when laying flat   [] History of DVT   [] Phlebitis   [] Swelling in legs   [] Varicose veins   [] Non-healing ulcers Pulmonary:   [] Uses home oxygen   [] Productive cough   [] Hemoptysis   [] Wheeze  [] COPD   [] Asthma Neurologic:  [] Dizziness  [] Blackouts   [] Seizures   [] History of stroke   [] History of TIA  [] Aphasia   [] Temporary blindness   [] Dysphagia   [] Weakness or numbness in arms   [] Weakness or numbness in legs Musculoskeletal:  [x] Arthritis   [] Joint swelling   [] Joint pain   [] Low back pain Hematologic:  [] Easy bruising  [] Easy bleeding   [] Hypercoagulable state   [] Anemic   Gastrointestinal:  [] Blood in stool   [] Vomiting blood  [] Gastroesophageal reflux/heartburn    [] Abdominal pain Genitourinary:  [] Chronic kidney disease   [] Difficult urination  [] Frequent urination  [] Burning with urination   [] Hematuria Skin:  [] Rashes   [] Ulcers   [] Wounds Psychological:  [] History of anxiety   []  History of major depression.    Physical Examination  BP (!) 145/77 (BP Location:  Left Arm)    Pulse 63    Resp 16    Wt 160 lb 12.8 oz (72.9 kg)    BMI 25.18 kg/m  Gen:  WD/WN, NAD.  Appears younger than stated age Head: [place]/AT, No temporalis wasting. Ear/Nose/Throat: Hearing somewhat diminished, nares w/o erythema or drainage Eyes: Conjunctiva clear. Sclera non-icteric Neck: Supple.  Trachea midline Pulmonary:  Good air movement, no use of accessory muscles.  Cardiac: RRR, no JVD Vascular:  Vessel Right Left  Radial Palpable Palpable                                   Gastrointestinal: soft, non-tender/non-distended. No guarding/reflex.  Musculoskeletal: M/S 5/5 throughout.  No deformity or atrophy.  Trace lower extremity edema. Neurologic: Sensation grossly intact in extremities.  Symmetrical.  Speech is fluent.  Psychiatric: Judgment intact, Mood & affect appropriate for pt's clinical situation. Dermatologic: No rashes or ulcers noted.  No cellulitis or open wounds.      Labs Recent Results (from the past 2160 hour(s))  I-STAT creatinine     Status: Abnormal   Collection Time: 07/27/21  2:53 PM  Result Value Ref Range   Creatinine, Ser 1.10 (H) 0.44 - 1.00 mg/dL    Radiology VAS [place] ABI WITH/WO TBI  Result Date: 08/01/2021  LOWER EXTREMITY DOPPLER STUDY Patient Name:  JANNY CRUTE  Date of Exam:   07/22/2021 Medical Rec #: [id number]         Accession #:    [id number] Date of Birth: 06/18/1931          Patient Gender: F Patient Age:   66 years Exam Location:  [facility] Procedure:      VAS [place] ABI WITH/WO TBI Referring Phys: Leotis Pain --------------------------------------------------------------------------------  Indications:  Peripheral artery disease.  Vascular Interventions: Evar. Performing Technologist: Almira Coaster RVS  Examination Guidelines: A complete evaluation includes at minimum, Doppler waveform signals and systolic blood pressure reading at the level of bilateral brachial, anterior tibial, and posterior tibial arteries, when vessel segments are accessible. Bilateral testing is considered an integral part of a complete examination. Photoelectric Plethysmograph (PPG) waveforms and toe systolic pressure readings are included as required and additional duplex testing as needed. Limited examinations for reoccurring indications may be performed as noted.  ABI Findings: +---------+------------------+-----+---------+--------+  Right     Rt Pressure (mmHg) Index Waveform  Comment   +---------+------------------+-----+---------+--------+  Brachial  177                                          +---------+------------------+-----+---------+--------+  ATA       186                1.05  triphasic           +---------+------------------+-----+---------+--------+  PTA       193                1.09  triphasic           +---------+------------------+-----+---------+--------+  Great Toe 210                1.19  Normal              +---------+------------------+-----+---------+--------+ +---------+------------------+-----+---------+-------+  Left      Lt Pressure (mmHg) Index Waveform  Comment  +---------+------------------+-----+---------+-------+  Brachial  172                                         +---------+------------------+-----+---------+-------+  ATA       175                0.99  triphasic          +---------+------------------+-----+---------+-------+  PTA       172                0.97  triphasic          +---------+------------------+-----+---------+-------+  Great Toe 208                1.18  Normal             +---------+------------------+-----+---------+-------+ Bilateral ABIs appear essentially unchanged compared to prior  study on 2018.  Summary: Right: Resting right ankle-brachial index is within normal range. No evidence of significant right lower extremity arterial disease. The right toe-brachial index is normal. Left: Resting left ankle-brachial index is within normal range. No evidence of significant left lower extremity arterial disease. The left toe-brachial index is normal.  *See table(s) above for measurements and observations.  Electronically signed by Leotis Pain MD on 08/01/2021 at 10:08:45 AM.    Final    VAS [place] EVAR DUPLEX  Result Date: 08/01/2021 Endovascular Aortic Repair Study (EVAR) Patient Name:  ALEXIANNA NACHREINER  Date of Exam:   07/22/2021 Medical Rec #: [id number]         Accession #:    [id number] Date of Birth: 08/31/1930          Patient Gender: F Patient Age:   5 years Exam Location:  [facility] Procedure:      VAS [place] EVAR DUPLEX Referring Phys: Leotis Pain --------------------------------------------------------------------------------  Indications: Follow up exam for EVAR. Vascular Interventions: 06/14/2016 EVAR with right leg extender and bilateral                         renal stents.  Comparison Study: 09/05/2017 Performing Technologist: Almira Coaster RVS  Examination Guidelines: A complete evaluation includes B-mode imaging, spectral Doppler, color Doppler, and power Doppler as needed of all accessible portions of each vessel. Bilateral testing is considered an integral part of a complete examination. Limited examinations for reoccurring indications may be performed as noted.  Endovascular Aortic Repair (EVAR): +----------+----------------+-------------------+-------------------+             Diameter AP (cm) Diameter Trans (cm) Velocities (cm/sec)  +----------+----------------+-------------------+-------------------+  Aorta      8.28             9.40                84                   +----------+----------------+-------------------+-------------------+  Right Limb 1.16             1.23                 53                   +----------+----------------+-------------------+-------------------+  Left Limb  1.40             1.42                72                   +----------+----------------+-------------------+-------------------+  Summary: Abdominal Aorta: Patent endovascular aneurysm repair with evidence of endoleak. The EVAR appears to be Enlarged compared to prior study 09/05/2017; which was 5.4 cms to 9.40 cms today. The EVAR appears to be thrombosed with no evidence of flow within.  *See table(s) above for measurements and observations.  Electronically signed by Leotis Pain MD on 08/01/2021 at 10:09:15 AM.    Final    CT Angio Abd/Pel w/ and/or w/o  Result Date: 07/27/2021 CLINICAL DATA:  Endovascular repair of abdominal aortic aneurysm. Postop evaluation. EXAM: CTA ABDOMEN AND PELVIS WITHOUT AND WITH CONTRAST TECHNIQUE: Multidetector CT imaging of the abdomen and pelvis was performed using the standard protocol during bolus administration of intravenous contrast. Multiplanar reconstructed images and MIPs were obtained and reviewed to evaluate the vascular anatomy. RADIATION DOSE REDUCTION: This exam was performed according to the departmental dose-optimization program which includes automated exposure control, adjustment of the mA and/or kV according to patient size and/or use of iterative reconstruction technique. CONTRAST:  9mL OMNIPAQUE IOHEXOL 350 MG/ML SOLN COMPARISON:  05/26/2016 FINDINGS: VASCULAR Aorta: Endovascular repair of the infrarenal abdominal aortic aneurysm. The aortic aneurysm sac has enlarged in size measuring 8.7 x 8.7 cm and previously measured up to 6.0 cm. The main aortic stent graft and bilateral limbs are patent. There is evidence for an endoleak along the left posterior aspect of the proximal aneurysm best seen on sequence 5 image 58. There is some irregularity along the left posterior aspect of the main body on sequence 5 image 60 and this could be an endoleak source.  There are prominent lumbar arteries in this area as well. This could represent either a type 2 or type 3 endoleak. Aorta between the celiac axis and SMA measures 3.0 cm and previously measured 2.9 cm. No evidence for aortic rupture or surrounding inflammatory changes. Celiac: Patent without evidence of aneurysm, dissection, vasculitis or significant stenosis. SMA: Patent without evidence of aneurysm, dissection, vasculitis or significant stenosis. Renals: Bilateral arteries are stented and patent. IMA: Origin of the IMA is occluded. Inflow: Stent graft limbs terminate in the distal bilateral common iliac arteries. Both stent graft limbs are patent. Internal and external iliac arteries are widely patent bilaterally. Proximal Outflow: Proximal femoral arteries are patent bilaterally. Veins: IVC is patent. Limited evaluation of the portal venous system but the portal confluence appears to be patent. Review of the MIP images confirms the above findings. NON-VASCULAR Lower chest: Stable 3 mm nodular density in the left lower lobe on sequence 7 image 36. Otherwise, lung bases are clear. Hepatobiliary: Cholecystectomy.  Normal appearance of the liver. Pancreas: Diffuse fatty infiltration of the pancreas without duct dilatation or surrounding inflammatory changes. Spleen: Normal in size without focal abnormality. Adrenals/Urinary Tract: Normal adrenal glands. Normal appearance of the kidneys without hydronephrosis. There may be small parapelvic cysts bilaterally. Normal appearance of the urinary bladder. Stomach/Bowel: Small hiatal hernia. Colonic diverticulosis without acute inflammation. No evidence for bowel dilatation or obstruction. Lymphatic: No significant lymph node enlargement in the abdomen or pelvis. Reproductive: Uterus and bilateral adnexa are unremarkable. Other: Negative for ascites.  Negative for free air. Musculoskeletal: Levoscoliosis in the lumbar spine. No acute bone abnormality. IMPRESSION: VASCULAR 1.  Endovascular repair of the abdominal aortic aneurysm. Bifurcated aortic stent graft is patent. The aortic aneurysm sac has markedly enlarged in size since 2017. Maximum dimension is 8.7 cm and previously measured 6.0 cm. There is an endoleak along the posterior proximal aspect of the aneurysm sac. Endoleak source is unclear but could represent  a type 2 endoleak related to lumbar arteries. A type 3 endoleak along the posterior aspect of the proximal graft cannot be excluded as described. 2. Bilateral renal artery stents are patent. NON-VASCULAR 1. No acute abnormality in the abdomen or pelvis. These results will be called to the ordering clinician or representative by the Radiologist Assistant, and communication documented in the PACS or [employer]. Electronically Signed   By: Markus Daft M.D.   On: 07/27/2021 16:51    Assessment/Plan Pain in limb ABIs were done recently with a right ABI of 1.09 and a left ABI of 0.99 with triphasic waveforms and normal digital pressures consistent with no arterial insufficiency.  It does not appear as if malperfusion is the cause of her lower extremity pain.     Hyperlipidemia lipid control important in reducing the progression of atherosclerotic disease.  AAA (abdominal aortic aneurysm) without rupture ([place]) The patient has undergone CT angiogram of the abdomen pelvis which I have independently reviewed.  This demonstrates an approximately 8.7 cm aneurysm sac around a patent stent graft.  There is a clear endoleak on the left posterior lateral area just below the native renal arteries.  She had a bilateral snorkel procedure and there appears to be about 2 cm of stent graft seal above this area but this is certainly a possibility for a type Ia endoleak from a pleat or a gutter and a snorkel procedure.  That would be a little less likely given the fact she initially had some regression of the sac but that is certainly a possibility.  The possibility was raised of a  type III endoleak although this seems a little bit high for this.  I think it is also a significant possibility that this is a large lumbar artery and this is a type II endoleak.  The type II or III endoleak would be much easier to fix than a proximal endoleak would likely require snorkeling the SMA and sacrificing one of the renal arteries.  I had a very long discussion today with her granddaughter who is a Marine scientist and the patient.  We discussed that if this could be fixed with a bridging limb for a type III endoleak or coil embolization for a type II endoleak, this would not be highly morbid and would either be an outpatient or overnight hospital stay not requiring general anesthesia.  A proximal extension would be a much bigger deal.  They voiced their understanding and are agreeable to proceed with an angiogram for an endoleak evaluation and potential treatment if a type II or III endoleak is seen.  If she is found to have a type I endoleak, we would have to make this a diagnostic study and discussed adjuvant and more involved therapies under general anesthesia another day.    Leotis Pain, MD  08/02/2021 4:21 PM    This note was created with Dragon medical transcription system.  Any errors from dictation are purely unintentional

## 2021-08-02 NOTE — Assessment & Plan Note (Signed)
The patient has undergone CT angiogram of the abdomen pelvis which I have independently reviewed.  This demonstrates an approximately 8.7 cm aneurysm sac around a patent stent graft.  There is a clear endoleak on the left posterior lateral area just below the native renal arteries.  She had a bilateral snorkel procedure and there appears to be about 2 cm of stent graft seal above this area but this is certainly a possibility for a type Ia endoleak from a pleat or a gutter and a snorkel procedure.  That would be a little less likely given the fact she initially had some regression of the sac but that is certainly a possibility.  The possibility was raised of a type III endoleak although this seems a little bit high for this.  I think it is also a significant possibility that this is a large lumbar artery and this is a type II endoleak.  The type II or III endoleak would be much easier to fix than a proximal endoleak would likely require snorkeling the SMA and sacrificing one of the renal arteries.  I had a very long discussion today with her granddaughter who is a Marine scientist and the patient.  We discussed that if this could be fixed with a bridging limb for a type III endoleak or coil embolization for a type II endoleak, this would not be highly morbid and would either be an outpatient or overnight hospital stay not requiring general anesthesia.  A proximal extension would be a much bigger deal.  They voiced their understanding and are agreeable to proceed with an angiogram for an endoleak evaluation and potential treatment if a type II or III endoleak is seen.  If she is found to have a type I endoleak, we would have to make this a diagnostic study and discussed adjuvant and more involved therapies under general anesthesia another day.

## 2021-08-02 NOTE — Progress Notes (Signed)
MRN : [id number]  Molly Fisher is a 88 y.o. (Dec 28, 1930) female who presents with chief complaint of  Chief Complaint  Patient presents with   Follow-up    Ct results  .  History of Present Illness: Patient returns today in follow up of her abdominal aortic aneurysm.  She was seen earlier this month after having been lost to follow-up for about 3 years.  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  Her initial duplex showed significant sac shrinkage, but on a duplex earlier this month the aneurysm sac had dramatically enlarged.  She remains asymptomatic from symptoms of an aneurysm. Specifically, the patient denies new back or abdominal pain, or signs of peripheral embolization The patient has undergone CT angiogram of the abdomen pelvis which I have independently reviewed.  This demonstrates an approximately 8.7 cm aneurysm sac around a patent stent graft.  There is a clear endoleak on the left posterior lateral area just below the native renal arteries.  She had a bilateral snorkel procedure and there appears to be about 2 cm of stent graft seal above this area but this is certainly a possibility for a type Ia endoleak from a pleat or a gutter and a snorkel procedure.  That would be a little less likely given the fact she initially had some regression of the sac but that is certainly a possibility.  The possibility was raised of a type III endoleak although this seems a little bit high for this.  I think it is also a significant possibility that this is a large lumbar artery and this is a type II endoleak.   Current Outpatient Medications  Medication Sig Dispense Refill   acetaminophen (TYLENOL) 500 MG tablet Take 500-1,000 mg by mouth every 6 (six) hours as needed (for pain).     Ascorbic Acid (VITAMIN C) 1000 MG tablet Take 1,000 mg by mouth daily.     Biotin 10 MG TABS Take by mouth.      Biotin 10000 MCG TABS Take 10,000 mcg by mouth daily.     Cholecalciferol (D3-1000) 25 MCG (1000 UT) capsule Take 1,000 Units by mouth daily.     escitalopram (LEXAPRO) 10 MG tablet Take 10 mg by mouth daily.     gabapentin (NEURONTIN) 100 MG capsule Take 100 mg by mouth daily.     GNP ASPIRIN LOW DOSE 81 MG EC tablet TAKE 1 TABLET BY MOUTH ONCE DAILY. 90 tablet 3   rosuvastatin (CRESTOR) 10 MG tablet Take 10 mg by mouth daily.     solifenacin (VESICARE) 5 MG tablet Take by mouth.     vitamin B-12 (CYANOCOBALAMIN) 1000 MCG tablet Take 1,000 mcg by mouth daily.     clopidogrel (PLAVIX) 75 MG tablet TAKE 1 TABLET BY MOUTH ONCE DAILY. (Patient not taking: No sig reported) 30 tablet 0   co-enzyme Q-10 30 MG capsule Take 60 mg by mouth 3 (three) times daily. (Patient not taking: Reported on 07/22/2021)     LORazepam (ATIVAN) 1 MG tablet Take 1 mg by mouth every 12 (twelve) hours as needed for anxiety. (Patient not taking: Reported on 07/22/2021)     naproxen sodium (ANAPROX) 220 MG tablet Take 220-440 mg by mouth 2 (two) times daily as needed (for pain.). (Patient not taking: Reported on 07/22/2021)     predniSONE (DELTASONE) 10 MG tablet  (Patient not taking: Reported on 07/22/2021)  traMADol (ULTRAM) 50 MG tablet Take 2 tablets (100 mg total) by mouth every 6 (six) hours as needed for moderate pain. (Patient not taking: Reported on 07/22/2021) 40 tablet 0   No current facility-administered medications for this visit.    Past Medical History:  Diagnosis Date   Anxiety    Arthritis    Cancer ([place])    squamous cell skin cancer   Depression    GERD (gastroesophageal reflux disease)    Hypercholesteremia    Lumbar pain    protrusion and stenosis    Past Surgical History:  Procedure Laterality Date   CATARACT EXTRACTION, BILATERAL     COLONOSCOPY     DILATION AND CURETTAGE OF UTERUS     EYE SURGERY     FRACTURE SURGERY Right    shoulder   JOINT REPLACEMENT Left    tkr   KNEE ARTHROPLASTY  Left 08/25/2015   Procedure: COMPUTER ASSISTED TOTAL KNEE ARTHROPLASTY;  Surgeon: Dereck Leep, MD;  Location: [facility];  Service: Orthopedics;  Laterality: Left;   PERIPHERAL VASCULAR CATHETERIZATION N/A 06/14/2016   Procedure: Endovascular Repair/Stent Graft;  Surgeon: Algernon Huxley, MD;  Location: [facility];  Service: Cardiovascular;  Laterality: N/A;    Social History        Tobacco Use   Smoking status: Never   Smokeless tobacco: Never  Substance Use Topics   Alcohol use: No   Drug use: No               Family History  Problem Relation Age of Onset   Aortic aneurysm Mother     Cancer Father    No bleeding or clotting disorders        Allergies  Allergen Reactions   Statins Other (See Comments)      Cramping in her legs and uneasy feeling.        REVIEW OF SYSTEMS (Negative unless checked)   Constitutional: [] Weight loss  [] Fever  [] Chills Cardiac: [] Chest pain   [] Chest pressure   [] Palpitations   [] Shortness of breath when laying flat   [] Shortness of breath at rest   [] Shortness of breath with exertion. Vascular:  [] Pain in legs with walking   [x] Pain in legs at rest   [] Pain in legs when laying flat   [] Claudication   [] Pain in feet when walking  [] Pain in feet at rest  [] Pain in feet when laying flat   [] History of DVT   [] Phlebitis   [] Swelling in legs   [] Varicose veins   [] Non-healing ulcers Pulmonary:   [] Uses home oxygen   [] Productive cough   [] Hemoptysis   [] Wheeze  [] COPD   [] Asthma Neurologic:  [] Dizziness  [] Blackouts   [] Seizures   [] History of stroke   [] History of TIA  [] Aphasia   [] Temporary blindness   [] Dysphagia   [] Weakness or numbness in arms   [] Weakness or numbness in legs Musculoskeletal:  [x] Arthritis   [] Joint swelling   [] Joint pain   [] Low back pain Hematologic:  [] Easy bruising  [] Easy bleeding   [] Hypercoagulable state   [] Anemic   Gastrointestinal:  [] Blood in stool   [] Vomiting blood  [] Gastroesophageal reflux/heartburn    [] Abdominal pain Genitourinary:  [] Chronic kidney disease   [] Difficult urination  [] Frequent urination  [] Burning with urination   [] Hematuria Skin:  [] Rashes   [] Ulcers   [] Wounds Psychological:  [] History of anxiety   []  History of major depression.    Physical Examination  BP (!) 145/77 (BP Location:  Left Arm)    Pulse 63    Resp 16    Wt 160 lb 12.8 oz (72.9 kg)    BMI 25.18 kg/m  Gen:  WD/WN, NAD.  Appears younger than stated age Head: [place]/AT, No temporalis wasting. Ear/Nose/Throat: Hearing somewhat diminished, nares w/o erythema or drainage Eyes: Conjunctiva clear. Sclera non-icteric Neck: Supple.  Trachea midline Pulmonary:  Good air movement, no use of accessory muscles.  Cardiac: RRR, no JVD Vascular:  Vessel Right Left  Radial Palpable Palpable                                   Gastrointestinal: soft, non-tender/non-distended. No guarding/reflex.  Musculoskeletal: M/S 5/5 throughout.  No deformity or atrophy.  Trace lower extremity edema. Neurologic: Sensation grossly intact in extremities.  Symmetrical.  Speech is fluent.  Psychiatric: Judgment intact, Mood & affect appropriate for pt's clinical situation. Dermatologic: No rashes or ulcers noted.  No cellulitis or open wounds.      Labs Recent Results (from the past 2160 hour(s))  I-STAT creatinine     Status: Abnormal   Collection Time: 07/27/21  2:53 PM  Result Value Ref Range   Creatinine, Ser 1.10 (H) 0.44 - 1.00 mg/dL    Radiology VAS [place] ABI WITH/WO TBI  Result Date: 08/01/2021  LOWER EXTREMITY DOPPLER STUDY Patient Name:  Molly Fisher  Date of Exam:   07/22/2021 Medical Rec #: [id number]         Accession #:    [id number] Date of Birth: Nov 17, 1930          Patient Gender: F Patient Age:   76 years Exam Location:  [facility] Procedure:      VAS [place] ABI WITH/WO TBI Referring Phys: Leotis Pain --------------------------------------------------------------------------------  Indications:  Peripheral artery disease.  Vascular Interventions: Evar. Performing Technologist: Almira Coaster RVS  Examination Guidelines: A complete evaluation includes at minimum, Doppler waveform signals and systolic blood pressure reading at the level of bilateral brachial, anterior tibial, and posterior tibial arteries, when vessel segments are accessible. Bilateral testing is considered an integral part of a complete examination. Photoelectric Plethysmograph (PPG) waveforms and toe systolic pressure readings are included as required and additional duplex testing as needed. Limited examinations for reoccurring indications may be performed as noted.  ABI Findings: +---------+------------------+-----+---------+--------+  Right     Rt Pressure (mmHg) Index Waveform  Comment   +---------+------------------+-----+---------+--------+  Brachial  177                                          +---------+------------------+-----+---------+--------+  ATA       186                1.05  triphasic           +---------+------------------+-----+---------+--------+  PTA       193                1.09  triphasic           +---------+------------------+-----+---------+--------+  Great Toe 210                1.19  Normal              +---------+------------------+-----+---------+--------+ +---------+------------------+-----+---------+-------+  Left      Lt Pressure (mmHg) Index Waveform  Comment  +---------+------------------+-----+---------+-------+  Brachial  172                                         +---------+------------------+-----+---------+-------+  ATA       175                0.99  triphasic          +---------+------------------+-----+---------+-------+  PTA       172                0.97  triphasic          +---------+------------------+-----+---------+-------+  Great Toe 208                1.18  Normal             +---------+------------------+-----+---------+-------+ Bilateral ABIs appear essentially unchanged compared to prior  study on 2018.  Summary: Right: Resting right ankle-brachial index is within normal range. No evidence of significant right lower extremity arterial disease. The right toe-brachial index is normal. Left: Resting left ankle-brachial index is within normal range. No evidence of significant left lower extremity arterial disease. The left toe-brachial index is normal.  *See table(s) above for measurements and observations.  Electronically signed by Leotis Pain MD on 08/01/2021 at 10:08:45 AM.    Final    VAS [place] EVAR DUPLEX  Result Date: 08/01/2021 Endovascular Aortic Repair Study (EVAR) Patient Name:  Molly Fisher  Date of Exam:   07/22/2021 Medical Rec #: [id number]         Accession #:    [id number] Date of Birth: 1930/12/25          Patient Gender: F Patient Age:   88 years Exam Location:  [facility] Procedure:      VAS [place] EVAR DUPLEX Referring Phys: Leotis Pain --------------------------------------------------------------------------------  Indications: Follow up exam for EVAR. Vascular Interventions: 06/14/2016 EVAR with right leg extender and bilateral                         renal stents.  Comparison Study: 09/05/2017 Performing Technologist: Almira Coaster RVS  Examination Guidelines: A complete evaluation includes B-mode imaging, spectral Doppler, color Doppler, and power Doppler as needed of all accessible portions of each vessel. Bilateral testing is considered an integral part of a complete examination. Limited examinations for reoccurring indications may be performed as noted.  Endovascular Aortic Repair (EVAR): +----------+----------------+-------------------+-------------------+             Diameter AP (cm) Diameter Trans (cm) Velocities (cm/sec)  +----------+----------------+-------------------+-------------------+  Aorta      8.28             9.40                84                   +----------+----------------+-------------------+-------------------+  Right Limb 1.16             1.23                 53                   +----------+----------------+-------------------+-------------------+  Left Limb  1.40             1.42                72                   +----------+----------------+-------------------+-------------------+  Summary: Abdominal Aorta: Patent endovascular aneurysm repair with evidence of endoleak. The EVAR appears to be Enlarged compared to prior study 09/05/2017; which was 5.4 cms to 9.40 cms today. The EVAR appears to be thrombosed with no evidence of flow within.  *See table(s) above for measurements and observations.  Electronically signed by Leotis Pain MD on 08/01/2021 at 10:09:15 AM.    Final    CT Angio Abd/Pel w/ and/or w/o  Result Date: 07/27/2021 CLINICAL DATA:  Endovascular repair of abdominal aortic aneurysm. Postop evaluation. EXAM: CTA ABDOMEN AND PELVIS WITHOUT AND WITH CONTRAST TECHNIQUE: Multidetector CT imaging of the abdomen and pelvis was performed using the standard protocol during bolus administration of intravenous contrast. Multiplanar reconstructed images and MIPs were obtained and reviewed to evaluate the vascular anatomy. RADIATION DOSE REDUCTION: This exam was performed according to the departmental dose-optimization program which includes automated exposure control, adjustment of the mA and/or kV according to patient size and/or use of iterative reconstruction technique. CONTRAST:  25mL OMNIPAQUE IOHEXOL 350 MG/ML SOLN COMPARISON:  05/26/2016 FINDINGS: VASCULAR Aorta: Endovascular repair of the infrarenal abdominal aortic aneurysm. The aortic aneurysm sac has enlarged in size measuring 8.7 x 8.7 cm and previously measured up to 6.0 cm. The main aortic stent graft and bilateral limbs are patent. There is evidence for an endoleak along the left posterior aspect of the proximal aneurysm best seen on sequence 5 image 58. There is some irregularity along the left posterior aspect of the main body on sequence 5 image 60 and this could be an endoleak source.  There are prominent lumbar arteries in this area as well. This could represent either a type 2 or type 3 endoleak. Aorta between the celiac axis and SMA measures 3.0 cm and previously measured 2.9 cm. No evidence for aortic rupture or surrounding inflammatory changes. Celiac: Patent without evidence of aneurysm, dissection, vasculitis or significant stenosis. SMA: Patent without evidence of aneurysm, dissection, vasculitis or significant stenosis. Renals: Bilateral arteries are stented and patent. IMA: Origin of the IMA is occluded. Inflow: Stent graft limbs terminate in the distal bilateral common iliac arteries. Both stent graft limbs are patent. Internal and external iliac arteries are widely patent bilaterally. Proximal Outflow: Proximal femoral arteries are patent bilaterally. Veins: IVC is patent. Limited evaluation of the portal venous system but the portal confluence appears to be patent. Review of the MIP images confirms the above findings. NON-VASCULAR Lower chest: Stable 3 mm nodular density in the left lower lobe on sequence 7 image 36. Otherwise, lung bases are clear. Hepatobiliary: Cholecystectomy.  Normal appearance of the liver. Pancreas: Diffuse fatty infiltration of the pancreas without duct dilatation or surrounding inflammatory changes. Spleen: Normal in size without focal abnormality. Adrenals/Urinary Tract: Normal adrenal glands. Normal appearance of the kidneys without hydronephrosis. There may be small parapelvic cysts bilaterally. Normal appearance of the urinary bladder. Stomach/Bowel: Small hiatal hernia. Colonic diverticulosis without acute inflammation. No evidence for bowel dilatation or obstruction. Lymphatic: No significant lymph node enlargement in the abdomen or pelvis. Reproductive: Uterus and bilateral adnexa are unremarkable. Other: Negative for ascites.  Negative for free air. Musculoskeletal: Levoscoliosis in the lumbar spine. No acute bone abnormality. IMPRESSION: VASCULAR 1.  Endovascular repair of the abdominal aortic aneurysm. Bifurcated aortic stent graft is patent. The aortic aneurysm sac has markedly enlarged in size since 2017. Maximum dimension is 8.7 cm and previously measured 6.0 cm. There is an endoleak along the posterior proximal aspect of the aneurysm sac. Endoleak source is unclear but could represent  a type 2 endoleak related to lumbar arteries. A type 3 endoleak along the posterior aspect of the proximal graft cannot be excluded as described. 2. Bilateral renal artery stents are patent. NON-VASCULAR 1. No acute abnormality in the abdomen or pelvis. These results will be called to the ordering clinician or representative by the Radiologist Assistant, and communication documented in the PACS or [employer]. Electronically Signed   By: Markus Daft M.D.   On: 07/27/2021 16:51    Assessment/Plan Pain in limb ABIs were done recently with a right ABI of 1.09 and a left ABI of 0.99 with triphasic waveforms and normal digital pressures consistent with no arterial insufficiency.  It does not appear as if malperfusion is the cause of her lower extremity pain.     Hyperlipidemia lipid control important in reducing the progression of atherosclerotic disease.  AAA (abdominal aortic aneurysm) without rupture ([place]) The patient has undergone CT angiogram of the abdomen pelvis which I have independently reviewed.  This demonstrates an approximately 8.7 cm aneurysm sac around a patent stent graft.  There is a clear endoleak on the left posterior lateral area just below the native renal arteries.  She had a bilateral snorkel procedure and there appears to be about 2 cm of stent graft seal above this area but this is certainly a possibility for a type Ia endoleak from a pleat or a gutter and a snorkel procedure.  That would be a little less likely given the fact she initially had some regression of the sac but that is certainly a possibility.  The possibility was raised of a  type III endoleak although this seems a little bit high for this.  I think it is also a significant possibility that this is a large lumbar artery and this is a type II endoleak.  The type II or III endoleak would be much easier to fix than a proximal endoleak would likely require snorkeling the SMA and sacrificing one of the renal arteries.  I had a very long discussion today with her granddaughter who is a Marine scientist and the patient.  We discussed that if this could be fixed with a bridging limb for a type III endoleak or coil embolization for a type II endoleak, this would not be highly morbid and would either be an outpatient or overnight hospital stay not requiring general anesthesia.  A proximal extension would be a much bigger deal.  They voiced their understanding and are agreeable to proceed with an angiogram for an endoleak evaluation and potential treatment if a type II or III endoleak is seen.  If she is found to have a type I endoleak, we would have to make this a diagnostic study and discussed adjuvant and more involved therapies under general anesthesia another day.    Leotis Pain, MD  08/02/2021 4:21 PM    This note was created with Dragon medical transcription system.  Any errors from dictation are purely unintentional

## 2021-08-03 ENCOUNTER — Telehealth (INDEPENDENT_AMBULATORY_CARE_PROVIDER_SITE_OTHER): Payer: Self-pay

## 2021-08-03 NOTE — Telephone Encounter (Signed)
I attempted to contact the patient's granddaughter Larene Beach to discuss getting the patient scheduled for a endoleak evaluation of a AAA. A message was left for a return call.

## 2021-08-03 NOTE — Telephone Encounter (Signed)
Patient's granddaughter returned my call and the patient is scheduled with Dr. Lucky Cowboy for a endoleak evaluation angiogram on 08/11/21 with a 8:15 am arrival time to the MM. Pre-procedure instructions were discussed and will be mailed.

## 2021-08-10 ENCOUNTER — Telehealth (INDEPENDENT_AMBULATORY_CARE_PROVIDER_SITE_OTHER): Payer: Self-pay

## 2021-08-10 NOTE — Telephone Encounter (Signed)
Patient has an upcoming angio on 08/11/21 with Dr. Lucky Cowboy and was concerned about the pre-procedure sheet she received and the medication. I explained that the sheet has medication that we stop or change and she does not from her chart take these meds. Patient stated she understood and I went over her pre-procedure instructions with her again.

## 2021-08-11 ENCOUNTER — Encounter
Admission: RE | Disposition: A | Discharge status: Home / Self Care | Payer: Self-pay | Source: Home / Self Care | Attending: Vascular Surgery

## 2021-08-11 ENCOUNTER — Encounter: Payer: Self-pay | Admitting: Registered Nurse

## 2021-08-11 ENCOUNTER — Encounter: Payer: Self-pay | Admitting: Vascular Surgery

## 2021-08-11 ENCOUNTER — Other Ambulatory Visit (INDEPENDENT_AMBULATORY_CARE_PROVIDER_SITE_OTHER): Payer: Self-pay | Admitting: Nurse Practitioner

## 2021-08-11 ENCOUNTER — Other Ambulatory Visit: Payer: Self-pay

## 2021-08-11 ENCOUNTER — Ambulatory Visit
Admission: RE | Admit: 2021-08-11 | Discharge: 2021-08-11 | Disposition: A | Discharge status: Home / Self Care | Payer: Medicare Other | Attending: Vascular Surgery | Admitting: Vascular Surgery

## 2021-08-11 DIAGNOSIS — E785 Hyperlipidemia, unspecified: Secondary | ICD-10-CM | POA: Insufficient documentation

## 2021-08-11 DIAGNOSIS — I9789 Other postprocedural complications and disorders of the circulatory system, not elsewhere classified: Secondary | ICD-10-CM | POA: Insufficient documentation

## 2021-08-11 DIAGNOSIS — M79609 Pain in unspecified limb: Secondary | ICD-10-CM | POA: Insufficient documentation

## 2021-08-11 DIAGNOSIS — I714 Abdominal aortic aneurysm, without rupture, unspecified: Secondary | ICD-10-CM | POA: Diagnosis present

## 2021-08-11 DIAGNOSIS — I7143 Infrarenal abdominal aortic aneurysm, without rupture: Secondary | ICD-10-CM

## 2021-08-11 HISTORY — PX: ABDOMINAL AORTOGRAM: CATH118222

## 2021-08-11 LAB — CREATININE, SERUM
Creatinine, Ser: 1.22 mg/dL — ABNORMAL HIGH (ref 0.44–1.00)
GFR, Estimated: 42 mL/min — ABNORMAL LOW (ref >60)

## 2021-08-11 LAB — BUN: BUN: 25 mg/dL — ABNORMAL HIGH (ref 8–23)

## 2021-08-11 SURGERY — ABDOMINAL AORTOGRAM
Anesthesia: Moderate Sedation

## 2021-08-11 MED ORDER — MIDAZOLAM HCL 2 MG/2ML IJ SOLN
INTRAMUSCULAR | Status: AC
  Filled 2021-08-11: qty 2

## 2021-08-11 MED ORDER — HYDROMORPHONE HCL 1 MG/ML IJ SOLN: 1.0000 mg | Freq: Once | INTRAMUSCULAR | Status: DC | PRN

## 2021-08-11 MED ORDER — HEPARIN SODIUM (PORCINE) 1000 UNIT/ML IJ SOLN
INTRAMUSCULAR | Status: DC | PRN
  Administered 2021-08-11: 3000 [IU] via INTRAVENOUS

## 2021-08-11 MED ORDER — FENTANYL CITRATE (PF) 100 MCG/2ML IJ SOLN
INTRAMUSCULAR | Status: DC | PRN
Start: 2021-08-11 — End: 2021-08-11
  Administered 2021-08-11 (×2): 25 ug via INTRAVENOUS

## 2021-08-11 MED ORDER — MIDAZOLAM HCL 2 MG/2ML IJ SOLN
INTRAMUSCULAR | Status: DC | PRN
Start: 2021-08-11 — End: 2021-08-11
  Administered 2021-08-11 (×2): 1 mg via INTRAVENOUS

## 2021-08-11 MED ORDER — MIDAZOLAM HCL 2 MG/ML PO SYRP: 8.0000 mg | ORAL_SOLUTION | Freq: Once | ORAL | Status: DC | PRN

## 2021-08-11 MED ORDER — CEFAZOLIN SODIUM-DEXTROSE 2-4 GM/100ML-% IV SOLN
2.0000 g | Freq: Once | INTRAVENOUS | Status: AC
  Administered 2021-08-11: 2 g via INTRAVENOUS

## 2021-08-11 MED ORDER — FAMOTIDINE 20 MG PO TABS: 40.0000 mg | ORAL_TABLET | Freq: Once | ORAL | Status: DC | PRN

## 2021-08-11 MED ORDER — SODIUM CHLORIDE 0.9 % IV SOLN: INTRAVENOUS | Status: DC

## 2021-08-11 MED ORDER — HEPARIN SODIUM (PORCINE) 1000 UNIT/ML IJ SOLN
INTRAMUSCULAR | Status: AC
  Filled 2021-08-11: qty 10

## 2021-08-11 MED ORDER — FENTANYL CITRATE PF 50 MCG/ML IJ SOSY
PREFILLED_SYRINGE | INTRAMUSCULAR | Status: AC
  Filled 2021-08-11: qty 1

## 2021-08-11 MED ORDER — METHYLPREDNISOLONE SODIUM SUCC 125 MG IJ SOLR: 125.0000 mg | Freq: Once | INTRAMUSCULAR | Status: DC | PRN

## 2021-08-11 MED ORDER — ONDANSETRON HCL 4 MG/2ML IJ SOLN: 4.0000 mg | Freq: Four times a day (QID) | INTRAMUSCULAR | Status: DC | PRN

## 2021-08-11 MED ORDER — DIPHENHYDRAMINE HCL 50 MG/ML IJ SOLN
50.0000 mg | Freq: Once | INTRAMUSCULAR | Status: DC | PRN
Start: 2021-08-11 — End: 2021-08-11

## 2021-08-11 MED ORDER — IODIXANOL 320 MG/ML IV SOLN
INTRAVENOUS | Status: DC | PRN
  Administered 2021-08-11: 85 mL

## 2021-08-11 SURGICAL SUPPLY — 16 items
CATH ANGIO 5F 80CM MHK 2 (CATHETERS) ×1 IMPLANT
CATH ANGIO 5F PIGTAIL 65CM (CATHETERS) ×1 IMPLANT
CATH C2 65CM (CATHETERS) ×1 IMPLANT
CATH MICROCATH PRGRT 2.8F 110 (CATHETERS) IMPLANT
CATH TEMPO 5F RIM 65CM (CATHETERS) ×1 IMPLANT
CATH VS15FR (CATHETERS) ×1 IMPLANT
COVER PROBE U/S 5X48 (MISCELLANEOUS) ×1 IMPLANT
DEVICE STARCLOSE SE CLOSURE (Vascular Products) ×1 IMPLANT
DEVICE TORQUE (MISCELLANEOUS) ×1 IMPLANT
GLIDEWIRE STIFF .35X180X3 HYDR (WIRE) ×2 IMPLANT
MICROCATH PROGREAT 2.8F 110 CM (CATHETERS) ×2
PACK ANGIOGRAPHY (CUSTOM PROCEDURE TRAY) ×1 IMPLANT
SHEATH BRITE TIP 5FRX11 (SHEATH) ×1 IMPLANT
SYR MEDRAD MARK 7 150ML (SYRINGE) ×1 IMPLANT
TUBING CONTRAST HIGH PRESS 72 (TUBING) ×1 IMPLANT
WIRE GUIDERIGHT .035X150 (WIRE) ×1 IMPLANT

## 2021-08-11 NOTE — Op Note (Signed)
[facility]  Percutaneous Study/Intervention Procedural Note   Date of Surgery: 08/11/2021  Surgeon(s):Qaadir Kent    Assistants:none  Pre-operative Diagnosis: AAA, s/p endovascular repair with indeterminate endoleak  Post-operative diagnosis:  Same  Procedure(s) Performed:             1.  Ultrasound guidance for vascular access right femoral artery             2.  Catheter placement into right internal iliac artery and superior mesenteric artery as well as the aorta from right femoral approach             3.  Aortogram and selective right internal iliac artery angiogram and superior mesenteric artery angiogram             4.  StarClose closure device right femoral artery  EBL: 5 cc  Contrast: 85 cc  Fluoro Time: 13.3 minutes  Moderate Conscious Sedation Time: approximately 66 minutes using 2 mg of Versed and 50 mcg of Fentanyl              Indications:  Patient is a 88 y.o.female with an enlarging aortic sac status post endovascular repair of her abdominal aortic aneurysm about 5 years ago.  A CT scan showed an indeterminate endoleak in the proximal portion of the aortic sac and she is brought to the angiogram suite for evaluation and potential treatment.  Risks and benefits are discussed and informed consent is obtained.   Procedure:  The patient was identified and appropriate procedural time out was performed.  The patient was then placed supine on the table and prepped and draped in the usual sterile fashion. Moderate conscious sedation was administered during a face to face encounter with the patient throughout the procedure with my supervision of the RN administering medicines and monitoring the patient's vital signs, pulse oximetry, telemetry and mental status throughout from the start of the procedure until the patient was taken to the recovery room. Ultrasound was used to evaluate the right common femoral artery.  It was patent .  A digital ultrasound  image was acquired.  A Seldinger needle was used to access the right common femoral artery under direct ultrasound guidance and a permanent image was performed.  A 0.035 J wire was advanced without resistance and a 5Fr sheath was placed.  Pigtail catheter was placed into the aorta and an AP aortogram was performed. This demonstrated normal renal artery flow status post bilateral renal artery stent placement at her previous surgery.  The aortic stent graft was widely patent.  There was an indeterminate endoleak seen in the proximal portion of the aortic sac on the left.  I then used a rim catheter to cannulate the right internal iliac artery to evaluate for a possible type II endoleak off of the pelvic circulation.  Selective imaging of the right internal iliac artery showed a normal branching pattern no branches feeding into the aneurysm causing a type II endoleak.  I then used a rim catheter to selectively cannulate the superior mesenteric artery.  Selective imaging of the SMA was performed and was found we had between 5 and 10 mm above the previous stent graft to the origin of the SMA.  There was mild atherosclerotic disease of the superior mesenteric artery was seen with normal branching pattern.  There were no type II endoleak seen off of the SMA into the aneurysm sac.  I then used the pigtail, rim catheter as well as a V S1 catheter  to image the aorta in multiple projections at the top of the graft to evaluate the endoleak.  On these images, there was an extremely small type Ia endoleak from a gutter leak on the left posterior lateral sidewall.  This did cause a blush at the top of the aneurysm sac but did not have much filling.  With a catheter in the main body of the graft, almost no flow was seen in this area so this was clearly from the top of the graft.  Attempts to get behind the graft and consider coil embolization in this area were not successful due to the small size of the gutter leg.  We used a  variety of catheters and wires in the prograde microcatheter but could never get stable position behind the graft.  I went out and talk to the granddaughter who is medically savvy and we discussed whether or not extending the stent superiorly at this time would be considered.  With concern in particular for her renal stents in place and the possibility of marked renal dysfunction, we elected to be conservative and reimage this in a couple of months in the office.  I elected to terminate the procedure. The sheath was removed and StarClose closure device was deployed in the right femoral artery with excellent hemostatic result. The patient was taken to the recovery room in stable condition having tolerated the procedure well.  Findings:               Aortogram: Patent stent graft with brisk flow and patent renal artery stents with brisk flow.  On detailed imaging with multiple obliquities, a very small type Ia endoleak is seen from the left posterior lateral sidewall from a gutter leak.             SMA: Mild atherosclerotic disease of the superior mesenteric artery was seen with normal branching pattern.  There were no type II endoleak seen off of the SMA into the aneurysm sac.  Right internal iliac artery: Selective imaging of the right internal iliac artery showed a normal branching pattern no branches feeding into the aneurysm causing a type II endoleak.    Disposition: Patient was taken to the recovery room in stable condition having tolerated the procedure well.  Complications: None  Leotis Pain 08/11/2021 10:39 AM   This note was created with Dragon Medical transcription system. Any errors in dictation are purely unintentional.

## 2021-08-11 NOTE — Interval H&P Note (Signed)
History and Physical Interval Note:  08/11/2021 8:39 AM  Molly Fisher  has presented today for surgery, with the diagnosis of Endoleak Aortagram Evaluation   AAA.  The various methods of treatment have been discussed with the patient and family. After consideration of risks, benefits and other options for treatment, the patient has consented to  Procedure(s): ABDOMINAL AORTOGRAM (N/A) as a surgical intervention.  The patient's history has been reviewed, patient examined, no change in status, stable for surgery.  I have reviewed the patient's chart and labs.  Questions were answered to the patient's satisfaction.     Leotis Pain

## 2022-03-14 ENCOUNTER — Emergency Department
Admission: EM | Admit: 2022-03-14 | Discharge: 2022-03-14 | Disposition: A | Discharge status: Home / Self Care | Payer: Medicare Other | Attending: Emergency Medicine | Admitting: Emergency Medicine

## 2022-03-14 ENCOUNTER — Other Ambulatory Visit: Payer: Self-pay

## 2022-03-14 ENCOUNTER — Emergency Department: Payer: Medicare Other

## 2022-03-14 ENCOUNTER — Encounter: Payer: Self-pay | Admitting: Emergency Medicine

## 2022-03-14 DIAGNOSIS — Y92009 Unspecified place in unspecified non-institutional (private) residence as the place of occurrence of the external cause: Secondary | ICD-10-CM | POA: Diagnosis not present

## 2022-03-14 DIAGNOSIS — S0003XA Contusion of scalp, initial encounter: Secondary | ICD-10-CM | POA: Insufficient documentation

## 2022-03-14 DIAGNOSIS — W1830XA Fall on same level, unspecified, initial encounter: Secondary | ICD-10-CM | POA: Diagnosis not present

## 2022-03-14 DIAGNOSIS — S0990XA Unspecified injury of head, initial encounter: Secondary | ICD-10-CM | POA: Diagnosis present

## 2022-03-14 DIAGNOSIS — W19XXXA Unspecified fall, initial encounter: Secondary | ICD-10-CM

## 2022-03-14 NOTE — ED Triage Notes (Signed)
Pt was getting up off the toilet and fell to the floor and hit back of head. Pt has knot on back of head. Pt denies any pain at this time. Pt Is not on blood thinners

## 2022-03-14 NOTE — ED Provider Triage Note (Signed)
Emergency Medicine Provider Triage Evaluation Note  Molly Fisher , a 88 y.o. female  was evaluated in triage.  Pt complains of fall at home. She was in the bathroom, stood up and lost her balance. She hit the back of her head. She denies loss of consciousness, but the fall was not witnessed and she does have some progressing dementia.  Physical Exam  BP (!) 164/79 (BP Location: Left Arm)   Pulse 62   Temp 97.8 F (36.6 C) (Oral)   Resp 16   Ht '5\' 6"'$  (1.676 m)   Wt 72.6 kg   SpO2 96%   BMI 25.83 kg/m  Gen:   Awake, no distress. Alert and oriented.   Resp:  Normal effort  MSK:   Moves extremities without difficulty  Other:  Hematoma to the occipital aspect of head.  Medical Decision Making  Medically screening exam initiated at 6:49 PM.  Appropriate orders placed.  Molly Fisher was informed that the remainder of the evaluation will be completed by another provider, this initial triage assessment does not replace that evaluation, and the importance of remaining in the ED until their evaluation is complete.    Victorino Dike, FNP 03/14/22 [phone]

## 2022-03-14 NOTE — ED Provider Notes (Signed)
[facility] Provider Note    Event Date/Time   First MD Initiated Contact with Patient 03/14/22 2121     (approximate)   History   Fall   HPI  Molly Fisher is a 88 y.o. female history of abdominal aortic aneurysm with stenting, depression, and arthritis.  Patient used to take Plavix but now longer does.  She only takes baby aspirin daily.  She follows with Dr. Lucky Cowboy for her aneurysm and reports that she has made the decision not to pursue surgical treatment   Earlier today she was in her home.  She uses the restroom, as she stood up and put her pants on she went to go lift herself and stumbled slightly forward, she tried to balance herself but fell falling with the back of her head sort of against the floor and countertop.  Reports the back of her head was sore and fairly swollen and the swelling seems to be slowly improving  No neck pain.  Slight soreness around the back of the left upper shoulder but reports just feels bruised does not feel it is significantly painful or uncomfortable.  Trouble breathing.  No abdominal pain.  No preceding illness no chest pain  Physical Exam   Triage Vital Signs: ED Triage Vitals  Enc Vitals Group     BP 03/14/22 1836 (!) 164/79     Pulse Rate 03/14/22 1836 62     Resp 03/14/22 1836 16     Temp 03/14/22 1836 97.8 F (36.6 C)     Temp Source 03/14/22 1836 Oral     SpO2 03/14/22 1836 96 %     Weight 03/14/22 1846 160 lb 0.9 oz (72.6 kg)     Height 03/14/22 1838 '5\' 6"'$  (1.676 m)     Head Circumference --      Peak Flow --      Pain Score 03/14/22 1846 0     Pain Loc --      Pain Edu? --      Excl. in [place]? --     Most recent vital signs: Vitals:   03/14/22 1836  BP: (!) 164/79  Pulse: 62  Resp: 16  Temp: 97.8 F (36.6 C)  SpO2: 96%     General: Awake, no distress.  Very pleasant.  Escorted by her family CV:  Good peripheral perfusion.  Normal heart tones Resp:  Normal effort.  Clear  bilateral Abd:  No distention.  Other:  No cervical thoracic or lumbar tenderness.  No step-offs or deformities.  No bruising contusion or injury to noted to the back or chest or abdomen.  She has good range of motion of the upper extremities without pain or discomfort.  No very mild area of point tenderness just above the left scapula but no associated bruising or surrounding tissue injury to noted.  Reports it to be very mild  Scalp atraumatic except for a approximately palm sized hematoma located over the posterior parietal region without overlying abrasion or bleeding.  It appears to be relatively small   ED Results / Procedures / Treatments   Labs (all labs ordered are listed, but only abnormal results are displayed) Labs Reviewed - No data to display   EKG  And interpreted by me at 1845 heart rate 60 QRS 110 QTc 410 Sinus rhythm first-degree AV block, evidence of possible old anteroseptal MI.  No evidence of acute ischemic abnormality noted.  Similar morphology to EKG from 2017   RADIOLOGY  Personally interpreted the CT scan of the head, hematoma noted over the parietal scalp but no intracranial hemorrhage on my review for gross abnormality  CT Head Wo Contrast  Result Date: 03/14/2022 CLINICAL DATA:  Fall, head injury EXAM: CT HEAD WITHOUT CONTRAST CT CERVICAL SPINE WITHOUT CONTRAST TECHNIQUE: Multidetector CT imaging of the head and cervical spine was performed following the standard protocol without intravenous contrast. Multiplanar CT image reconstructions of the cervical spine were also generated. RADIATION DOSE REDUCTION: This exam was performed according to the departmental dose-optimization program which includes automated exposure control, adjustment of the mA and/or kV according to patient size and/or use of iterative reconstruction technique. COMPARISON:  CT head 12/25/2014 FINDINGS: CT HEAD FINDINGS Brain: Generalized atrophy with progression. Patchy white matter  hypodensity bilaterally is mild. Negative for acute hemorrhage, acute infarct, mass lesion. Vascular: Negative for hyperdense vessel Skull: Negative for skull fracture.  Left parietal scalp hematoma. Sinuses/Orbits: Paranasal sinuses clear.  Negative orbit Other: None CT CERVICAL SPINE FINDINGS Alignment: Mild anterolisthesis C2-3 and C3-4. Mild retrolisthesis C5-6. Mild anterolisthesis C7-T1. Reversal of cervical lordosis with moderate kyphosis Skull base and vertebrae: Negative for fracture Soft tissues and spinal canal: No soft tissue mass or edema. Atherosclerotic calcification of the carotid bifurcation bilaterally. Disc levels: Disc degeneration and spondylosis most prominent at C4-5, C5-6, C6-7. Mild spinal stenosis and mild to moderate foraminal stenosis bilaterally at C5-6. Upper chest: Lung apices clear bilaterally Other: None IMPRESSION: 1. No acute intracranial abnormality. Atrophy and chronic microvascular ischemia. Left parietal scalp hematoma 2. Cervical spondylosis.  Negative for fracture Electronically Signed   By: Franchot Gallo M.D.   On: 03/14/2022 19:16   CT Cervical Spine Wo Contrast  Result Date: 03/14/2022 CLINICAL DATA:  Fall, head injury EXAM: CT HEAD WITHOUT CONTRAST CT CERVICAL SPINE WITHOUT CONTRAST TECHNIQUE: Multidetector CT imaging of the head and cervical spine was performed following the standard protocol without intravenous contrast. Multiplanar CT image reconstructions of the cervical spine were also generated. RADIATION DOSE REDUCTION: This exam was performed according to the departmental dose-optimization program which includes automated exposure control, adjustment of the mA and/or kV according to patient size and/or use of iterative reconstruction technique. COMPARISON:  CT head 12/25/2014 FINDINGS: CT HEAD FINDINGS Brain: Generalized atrophy with progression. Patchy white matter hypodensity bilaterally is mild. Negative for acute hemorrhage, acute infarct, mass lesion.  Vascular: Negative for hyperdense vessel Skull: Negative for skull fracture.  Left parietal scalp hematoma. Sinuses/Orbits: Paranasal sinuses clear.  Negative orbit Other: None CT CERVICAL SPINE FINDINGS Alignment: Mild anterolisthesis C2-3 and C3-4. Mild retrolisthesis C5-6. Mild anterolisthesis C7-T1. Reversal of cervical lordosis with moderate kyphosis Skull base and vertebrae: Negative for fracture Soft tissues and spinal canal: No soft tissue mass or edema. Atherosclerotic calcification of the carotid bifurcation bilaterally. Disc levels: Disc degeneration and spondylosis most prominent at C4-5, C5-6, C6-7. Mild spinal stenosis and mild to moderate foraminal stenosis bilaterally at C5-6. Upper chest: Lung apices clear bilaterally Other: None IMPRESSION: 1. No acute intracranial abnormality. Atrophy and chronic microvascular ischemia. Left parietal scalp hematoma 2. Cervical spondylosis.  Negative for fracture Electronically Signed   By: Franchot Gallo M.D.   On: 03/14/2022 19:16       PROCEDURES:  Critical Care performed: No  Procedures   MEDICATIONS ORDERED IN ED: Medications - No data to display   IMPRESSION / MDM / [place] / ED COURSE  I reviewed the triage vital signs and the nursing notes.  Differential diagnosis includes, but is not limited to, mechanical fall, contusion, rule out intracranial hemorrhage, exclude cervical injury.  No clinical symptoms suggestive of concussion or severe head injury.  Reassuring exam fully alert well oriented very pleasant.  Also slight area of point tenderness over the left upper posterior scapular region, but no associated dyspnea abnormal lung sounds or chest pain, also patient reported to be very minimal discomfort.  Overall appears to be a mechanical fall in nature no preceding symptoms no syncopal like symptomatology, and she reports that she stumbled forward as she was getting herself up from the use the  toilet.  She is accompanied by her family.  She has a life alert in place which she is able to utilize.  She does not fall frequently, and her goals of care as stated by her and family RTo continue to live in her home as independent as possible  Patient's presentation is most consistent with acute complicated illness / injury requiring diagnostic workup.  The patient is on the cardiac monitor to evaluate for evidence of arrhythmia and/or significant heart rate changes.  Return precautions and treatment recommendations and follow-up discussed with the patient who is agreeable with the plan.      FINAL CLINICAL IMPRESSION(S) / ED DIAGNOSES   Final diagnoses:  Scalp hematoma, initial encounter  Minor head injury, initial encounter  Fall in home, initial encounter     Rx / DC Orders   ED Discharge Orders     None        Note:  This document was prepared using Dragon voice recognition software and may include unintentional dictation errors.   Delman Kitten, MD 03/14/22 2206

## 2022-03-14 NOTE — ED Triage Notes (Signed)
First Nurse Note:  [facility] via [facility].  Mechanical fall at home, has a hematoma to back of head. No LOC. Takes aspirin. Fall was in bathroom.  VS wnl.

## 2022-04-17 ENCOUNTER — Other Ambulatory Visit: Payer: Self-pay | Admitting: Internal Medicine

## 2022-04-17 DIAGNOSIS — R17 Unspecified jaundice: Secondary | ICD-10-CM

## 2022-05-09 ENCOUNTER — Other Ambulatory Visit: Payer: Medicare Other

## 2022-05-16 ENCOUNTER — Ambulatory Visit
Admission: RE | Admit: 2022-05-16 | Discharge: 2022-05-16 | Disposition: A | Discharge status: Home / Self Care | Payer: Medicare Other | Source: Ambulatory Visit | Attending: Internal Medicine | Admitting: Internal Medicine

## 2022-05-16 DIAGNOSIS — R17 Unspecified jaundice: Secondary | ICD-10-CM

## 2022-05-23 ENCOUNTER — Emergency Department: Payer: Medicare Other

## 2022-05-23 ENCOUNTER — Other Ambulatory Visit: Payer: Self-pay

## 2022-05-23 ENCOUNTER — Inpatient Hospital Stay
Admission: EM | Admit: 2022-05-23 | Discharge: 2022-05-26 | DRG: 951 | Disposition: A | Discharge status: Hospice | Payer: Medicare Other | Attending: Hospitalist | Admitting: Hospitalist

## 2022-05-23 ENCOUNTER — Encounter: Payer: Self-pay | Admitting: Emergency Medicine

## 2022-05-23 DIAGNOSIS — M25551 Pain in right hip: Secondary | ICD-10-CM | POA: Diagnosis present

## 2022-05-23 DIAGNOSIS — M1990 Unspecified osteoarthritis, unspecified site: Secondary | ICD-10-CM | POA: Diagnosis present

## 2022-05-23 DIAGNOSIS — T82310D Breakdown (mechanical) of aortic (bifurcation) graft (replacement), subsequent encounter: Secondary | ICD-10-CM

## 2022-05-23 DIAGNOSIS — Z515 Encounter for palliative care: Secondary | ICD-10-CM | POA: Diagnosis not present

## 2022-05-23 DIAGNOSIS — E7800 Pure hypercholesterolemia, unspecified: Secondary | ICD-10-CM | POA: Diagnosis present

## 2022-05-23 DIAGNOSIS — W1830XA Fall on same level, unspecified, initial encounter: Secondary | ICD-10-CM | POA: Diagnosis present

## 2022-05-23 DIAGNOSIS — Y92009 Unspecified place in unspecified non-institutional (private) residence as the place of occurrence of the external cause: Secondary | ICD-10-CM

## 2022-05-23 DIAGNOSIS — F411 Generalized anxiety disorder: Secondary | ICD-10-CM | POA: Diagnosis present

## 2022-05-23 DIAGNOSIS — M25552 Pain in left hip: Secondary | ICD-10-CM | POA: Diagnosis present

## 2022-05-23 DIAGNOSIS — Z85828 Personal history of other malignant neoplasm of skin: Secondary | ICD-10-CM

## 2022-05-23 DIAGNOSIS — I10 Essential (primary) hypertension: Secondary | ICD-10-CM | POA: Diagnosis present

## 2022-05-23 DIAGNOSIS — W19XXXA Unspecified fall, initial encounter: Secondary | ICD-10-CM | POA: Diagnosis present

## 2022-05-23 DIAGNOSIS — Z9181 History of falling: Secondary | ICD-10-CM

## 2022-05-23 DIAGNOSIS — T796XXA Traumatic ischemia of muscle, initial encounter: Secondary | ICD-10-CM | POA: Diagnosis present

## 2022-05-23 DIAGNOSIS — R296 Repeated falls: Secondary | ICD-10-CM | POA: Diagnosis present

## 2022-05-23 DIAGNOSIS — K219 Gastro-esophageal reflux disease without esophagitis: Secondary | ICD-10-CM | POA: Diagnosis present

## 2022-05-23 DIAGNOSIS — Z66 Do not resuscitate: Secondary | ICD-10-CM | POA: Diagnosis present

## 2022-05-23 DIAGNOSIS — F32A Depression, unspecified: Secondary | ICD-10-CM | POA: Diagnosis present

## 2022-05-23 DIAGNOSIS — I7143 Infrarenal abdominal aortic aneurysm, without rupture: Secondary | ICD-10-CM

## 2022-05-23 DIAGNOSIS — I714 Abdominal aortic aneurysm, without rupture, unspecified: Secondary | ICD-10-CM | POA: Diagnosis present

## 2022-05-23 DIAGNOSIS — R4182 Altered mental status, unspecified: Secondary | ICD-10-CM | POA: Diagnosis present

## 2022-05-23 DIAGNOSIS — R7303 Prediabetes: Secondary | ICD-10-CM | POA: Diagnosis present

## 2022-05-23 LAB — CBC
HCT: 32.8 % — ABNORMAL LOW (ref 36.0–46.0)
Hemoglobin: 10.1 g/dL — ABNORMAL LOW (ref 12.0–15.0)
MCH: 26.5 pg (ref 26.0–34.0)
MCHC: 30.8 g/dL (ref 30.0–36.0)
MCV: 86.1 fL (ref 80.0–100.0)
Platelets: 167 10*3/uL (ref 150–400)
RBC: 3.81 MIL/uL — ABNORMAL LOW (ref 3.87–5.11)
RDW: 15.9 % — ABNORMAL HIGH (ref 11.5–15.5)
WBC: 7.2 10*3/uL (ref 4.0–10.5)
nRBC: 0 % (ref 0.0–0.2)

## 2022-05-23 LAB — COMPREHENSIVE METABOLIC PANEL
ALT: 19 U/L (ref 0–44)
AST: 39 U/L (ref 15–41)
Albumin: 3.6 g/dL (ref 3.5–5.0)
Alkaline Phosphatase: 57 U/L (ref 38–126)
Anion gap: 9 (ref 5–15)
BUN: 15 mg/dL (ref 8–23)
CO2: 24 mmol/L (ref 22–32)
Calcium: 9.3 mg/dL (ref 8.9–10.3)
Chloride: 110 mmol/L (ref 98–111)
Creatinine, Ser: 0.72 mg/dL (ref 0.44–1.00)
GFR, Estimated: >60 mL/min (ref >60)
Glucose, Bld: 113 mg/dL — ABNORMAL HIGH (ref 70–99)
Potassium: 3.6 mmol/L (ref 3.5–5.1)
Sodium: 143 mmol/L (ref 135–145)
Total Bilirubin: 0.9 mg/dL (ref 0.3–1.2)
Total Protein: 6.6 g/dL (ref 6.5–8.1)

## 2022-05-23 LAB — CK: Total CK: 730 U/L — ABNORMAL HIGH (ref 38–234)

## 2022-05-23 LAB — CBG MONITORING, ED: Glucose-Capillary: 102 mg/dL — ABNORMAL HIGH (ref 70–99)

## 2022-05-23 MED ORDER — ONDANSETRON HCL 4 MG/2ML IJ SOLN: 4.0000 mg | Freq: Four times a day (QID) | INTRAMUSCULAR | Status: DC | PRN

## 2022-05-23 MED ORDER — POLYVINYL ALCOHOL 1.4 % OP SOLN: 1.0000 [drp] | Freq: Four times a day (QID) | OPHTHALMIC | Status: DC | PRN

## 2022-05-23 MED ORDER — ACETAMINOPHEN 650 MG RE SUPP: 650.0000 mg | Freq: Four times a day (QID) | RECTAL | Status: DC | PRN

## 2022-05-23 MED ORDER — LORAZEPAM 1 MG PO TABS
1.0000 mg | ORAL_TABLET | ORAL | Status: DC | PRN
  Administered 2022-05-24: 1 mg via ORAL
  Filled 2022-05-23: qty 1

## 2022-05-23 MED ORDER — MORPHINE SULFATE (PF) 2 MG/ML IV SOLN
1.0000 mg | INTRAVENOUS | Status: DC | PRN
  Administered 2022-05-26: 1 mg via INTRAVENOUS
  Filled 2022-05-23 (×2): qty 1

## 2022-05-23 MED ORDER — ONDANSETRON 4 MG PO TBDP: 4.0000 mg | ORAL_TABLET | Freq: Four times a day (QID) | ORAL | Status: DC | PRN

## 2022-05-23 MED ORDER — BIOTENE DRY MOUTH MT LIQD: 15.0000 mL | OROMUCOSAL | Status: DC | PRN

## 2022-05-23 MED ORDER — ACETAMINOPHEN 325 MG PO TABS: 650.0000 mg | ORAL_TABLET | Freq: Four times a day (QID) | ORAL | Status: DC | PRN

## 2022-05-23 MED ORDER — SODIUM CHLORIDE 0.9 % IV SOLN: Freq: Once | INTRAVENOUS | Status: AC

## 2022-05-23 MED ORDER — LORAZEPAM 2 MG/ML PO CONC: 1.0000 mg | ORAL | Status: DC | PRN

## 2022-05-23 MED ORDER — LORAZEPAM 2 MG/ML IJ SOLN
1.0000 mg | INTRAMUSCULAR | Status: DC | PRN
  Administered 2022-05-23: 1 mg via INTRAVENOUS
  Filled 2022-05-23: qty 1

## 2022-05-23 MED ORDER — ONDANSETRON HCL 4 MG/2ML IJ SOLN
4.0000 mg | Freq: Once | INTRAMUSCULAR | Status: AC
  Administered 2022-05-23: 4 mg via INTRAVENOUS
  Filled 2022-05-23: qty 2

## 2022-05-23 MED ORDER — MORPHINE SULFATE (PF) 2 MG/ML IV SOLN
2.0000 mg | Freq: Once | INTRAVENOUS | Status: AC
  Administered 2022-05-23: 2 mg via INTRAVENOUS
  Filled 2022-05-23: qty 1

## 2022-05-23 MED ORDER — IOHEXOL 300 MG/ML  SOLN
100.0000 mL | Freq: Once | INTRAMUSCULAR | Status: AC | PRN
  Administered 2022-05-23: 100 mL via INTRAVENOUS

## 2022-05-23 NOTE — ED Triage Notes (Signed)
First Nurse: Pt here with a fall last night, family states pt was on the floor when they arrived. Family states pt's mental status has changed but pt remembers the fall. Pt has early Alzheimer's. Pt c/o bilateral leg pain, has neuropathy. Pt is usually ambulatory but today cannot move her legs. Hx of a aortic aneurysm, stent placed 2 months ago but is leaking, opted not to do surgery.  148/92 98.6 124-cbg

## 2022-05-23 NOTE — ED Notes (Signed)
Patients granddaughter requesting palliative care to be consulted.

## 2022-05-23 NOTE — ED Provider Notes (Signed)
[facility] Provider Note    Event Date/Time   First MD Initiated Contact with Patient 05/23/22 1836     (approximate)   History   Fall and Altered Mental Status   HPI  Molly Fisher is a 88 y.o. female who has been falling more in the last few days.  Yesterday she fell apparently right after her granddaughter left her at her house and spent the whole night on the floor.  She has a Runner, broadcasting/film/video alert button to push but she did not push it.  She appears to be having some delusions about someone being in the house with her last night.      Physical Exam   Triage Vital Signs: ED Triage Vitals  Enc Vitals Group     BP 05/23/22 1242 (!) 145/66     Pulse Rate 05/23/22 1242 74     Resp 05/23/22 1242 20     Temp 05/23/22 1242 (!) 97.5 F (36.4 C)     Temp Source 05/23/22 1242 Oral     SpO2 05/23/22 1242 93 %     Weight 05/23/22 1243 160 lb (72.6 kg)     Height 05/23/22 1243 '5\' 6"'$  (1.676 m)     Head Circumference --      Peak Flow --      Pain Score 05/23/22 1243 3     Pain Loc --      Pain Edu? --      Excl. in [place]? --     Most recent vital signs: Vitals:   05/23/22 2115 05/23/22 2123  BP: (!) 166/85 (!) 166/85  Pulse: 74 70  Resp:  20  Temp:  (!) 97.5 F (36.4 C)  SpO2: 97% 100%    General: Awake, no distress.  Head normocephalic atraumatic Neck is supple and nontender CV:  Good peripheral perfusion.  Heart regular rate and rhythm no audible murmurs Resp:  Normal effort.  Lungs are clear Abd:  No distention.  There is a firm mass just to the left of the epigastrium which seems to be slightly tender Back not tender over the spine to palpation there is no tenderness in the hips either.   ED Results / Procedures / Treatments   Labs (all labs ordered are listed, but only abnormal results are displayed) Labs Reviewed  COMPREHENSIVE METABOLIC PANEL - Abnormal; Notable for the following components:      Result Value   Glucose, Bld 113 (*)     All other components within normal limits  CBC - Abnormal; Notable for the following components:   RBC 3.81 (*)    Hemoglobin 10.1 (*)    HCT 32.8 (*)    RDW 15.9 (*)    All other components within normal limits  CK - Abnormal; Notable for the following components:   Total CK 730 (*)    All other components within normal limits  CBG MONITORING, ED - Abnormal; Notable for the following components:   Glucose-Capillary 102 (*)    All other components within normal limits     EKG     RADIOLOGY CT the head and neck read by radiology reviewed and interpreted by me show no acute disease CT of the abdomen pelvis read by radiology reviewed and interpreted by me show a massive abdominal aortic aneurysm likely with impending rupture.   PROCEDURES:  Critical Care performed:   Procedures   MEDICATIONS ORDERED IN ED: Medications  acetaminophen (TYLENOL) tablet 650 mg (has  no administration in time range)    Or  acetaminophen (TYLENOL) suppository 650 mg (has no administration in time range)  ondansetron (ZOFRAN-ODT) disintegrating tablet 4 mg (has no administration in time range)    Or  ondansetron (ZOFRAN) injection 4 mg (has no administration in time range)  antiseptic oral rinse (BIOTENE) solution 15 mL (has no administration in time range)  polyvinyl alcohol (LIQUIFILM TEARS) 1.4 % ophthalmic solution 1 drop (has no administration in time range)  morphine (PF) 2 MG/ML injection 1 mg (has no administration in time range)  LORazepam (ATIVAN) tablet 1 mg ( Oral See Alternative 05/23/22 2325)    Or  LORazepam (ATIVAN) 2 MG/ML concentrated solution 1 mg ( Sublingual See Alternative 05/23/22 2325)    Or  LORazepam (ATIVAN) injection 1 mg (1 mg Intravenous Given 05/23/22 2325)  0.9 %  sodium chloride infusion (0 mLs Intravenous Stopped 05/23/22 2306)  iohexol (OMNIPAQUE) 300 MG/ML solution 100 mL (100 mLs Intravenous Contrast Given 05/23/22 2008)  morphine (PF) 2 MG/ML injection 2 mg  (2 mg Intravenous Given 05/23/22 2121)  ondansetron (ZOFRAN) injection 4 mg (4 mg Intravenous Given 05/23/22 2122)     IMPRESSION / MDM / ASSESSMENT AND PLAN / ED COURSE  I reviewed the triage vital signs and the nursing notes. Discussed in detail with the granddaughter who is the patient's caregiver who is here with her.  Patient has indicated she wants quality of life over quantity and did not want any further work on the aneurysm 1 endoleak was diagnosed sometime ago.  She is now somewhat confused.  In keeping with her previous wishes granddaughter wishes her to be comfort care only.  Wishes her to be kept here as she lives by herself.    Patient's presentation is most consistent with acute presentation with potential threat to life or bodily function.       FINAL CLINICAL IMPRESSION(S) / ED DIAGNOSES   Final diagnoses:  Abdominal aortic aneurysm (AAA) greater than 5.0 cm in diameter in female [facility])  Actual diagnosis is abdominal aortic aneurysm over 10 cm in diameter impending rupture   Rx / DC Orders   ED Discharge Orders     None        Note:  This document was prepared using Dragon voice recognition software and may include unintentional dictation errors.   Nena Polio, MD 05/23/22 [phone]

## 2022-05-23 NOTE — Progress Notes (Signed)
Molly Fisher requested spiritual care support as she has found church comforting to her. She enjoyed going to church and being part of the choir. Confusion impacted visit. Granddaughter was present and is primary caregiver, with significant understanding of EOL. Caregiver support was provided. Prayer was offered with [facility] for which she offered prayer including being thankful for her granddaughters support.     05/23/22 2300  Clinical Encounter Type  Visited With Patient and family together  Visit Type ED  Referral From Nurse  Spiritual Encounters  Spiritual Needs Emotional

## 2022-05-23 NOTE — ED Triage Notes (Signed)
Patient in recliner in triage with granddaughter who is also POA. Family states patient fell Saturday but did not want to be transported. Sunday fell again and helped herself up. Patient had been walking around the house and doing her ADLs. Granddaughter last saw patient around 5:15pm yesterday and this morning when going to pick up patient for doctor appt found patient lying on the floor. States patient was out of it and not acting her normal self. Family reports a new mass to left abdomen that is new.

## 2022-05-23 NOTE — H&P (Signed)
History and Physical    Patient: Molly Fisher GQQ:[id number] DOB: [birth year] DOA: 05/23/2022 DOS: the patient was seen and examined on 05/24/2022 PCP: Molly Ruths, MD  Patient coming from: Home  Chief Complaint:  Chief Complaint  Patient presents with   Fall   Altered Mental Status   HPI: Molly Fisher is a 88 y.o. female with medical history significant of AAA s/p EVAR complicated by endoleak, hypertension, hyperlipidemia, generalized anxiety, prediabetes, who presents to the ED with complaints of ground-level fall and altered mental status.  History obtained from patient's granddaughter Molly Fisher at bedside due to patient's altered mental status.  Molly Fisher states that Molly Fisher has had several falls over the last few days.  She fell on 12/2, 12/3 and fell again on 12/4 and was unable to stand up on her own.  Through the night on 12/4 into the morning of 12/5, she had several episodes of urinary and bowel incontinence due to inability to stand up to use the restroom.  Today when Molly Fisher went to go check on her grandmother, she found Molly Fisher on the ground and altered.  She notes that Molly Fisher has a history of confusion at times but no history of dementia.  She has seemed more confused in the last few days.  In addition, she states Molly Fisher has been complaining of abdominal pain and bilateral hip pain.  ED course: On arrival to the ED, patient was hypertensive at 172/70 with heart rate of 72.  She was saturating at 97% on room air. Initial workup remarkable for CK of 730, hemoglobin of 10.1.  CT abdomen/pelvis was obtained that demonstrated abdominal aorta aneurysm with impending rupture given interval increase in size and periaortic fat stranding.  These results were discussed with patient's granddaughter and primary decision maker Molly Fisher.  Due to patient's advanced age and prior reluctance towards endoleak repair, comfort measures only were requested.  TR[facility] contacted  for admission for end-of-life care.  Review of Systems: As mentioned in the history of present illness. All other systems reviewed and are negative.  Past Medical History:  Diagnosis Date   Anxiety    Arthritis    Cancer ([place])    squamous cell skin cancer   Depression    GERD (gastroesophageal reflux disease)    Hypercholesteremia    Lumbar pain    protrusion and stenosis   Past Surgical History:  Procedure Laterality Date   ABDOMINAL AORTOGRAM N/A 08/11/2021   Procedure: ABDOMINAL AORTOGRAM;  Surgeon: Molly Huxley, MD;  Location: [facility];  Service: Cardiovascular;  Laterality: N/A;   CATARACT EXTRACTION, BILATERAL     COLONOSCOPY     DILATION AND CURETTAGE OF UTERUS     EYE SURGERY     FRACTURE SURGERY Right    shoulder   JOINT REPLACEMENT Left    tkr   KNEE ARTHROPLASTY Left 08/25/2015   Procedure: COMPUTER ASSISTED TOTAL KNEE ARTHROPLASTY;  Surgeon: Dereck Leep, MD;  Location: [facility];  Service: Orthopedics;  Laterality: Left;   PERIPHERAL VASCULAR CATHETERIZATION N/A 06/14/2016   Procedure: Endovascular Repair/Stent Graft;  Surgeon: Molly Huxley, MD;  Location: [facility];  Service: Cardiovascular;  Laterality: N/A;   Social History:  reports that she has never smoked. She has never used smokeless tobacco. She reports that she does not drink alcohol and does not use drugs.  Allergies  Allergen Reactions   Statins Other (See Comments)    Cramping in her legs and  uneasy feeling.    Family History  Problem Relation Age of Onset   Aortic aneurysm Mother    Cancer Father     Prior to Admission medications   Medication Sig Start Date End Date Taking? Authorizing Provider  Ascorbic Acid (VITAMIN C) 1000 MG tablet Take 1,000 mg by mouth daily.   Yes [provider]  Biotin 10000 MCG TABS Take 10,000 mcg by mouth daily.   Yes [provider]  cholecalciferol (VITAMIN D3) 25 MCG (1000 UNIT) tablet Take 1,000 Units by mouth daily.  04/21/22  Yes [provider]  escitalopram (LEXAPRO) 10 MG tablet Take 10 mg by mouth daily. 07/11/21  Yes [provider]  GNP ASPIRIN LOW DOSE 81 MG EC tablet TAKE 1 TABLET BY MOUTH ONCE DAILY. 03/12/17  Yes Dew, Erskine Squibb, MD  rosuvastatin (CRESTOR) 5 MG tablet Take 5 mg by mouth daily.   Yes [provider]  vitamin B-12 (CYANOCOBALAMIN) 1000 MCG tablet Take 1,000 mcg by mouth daily.   Yes [provider]  acetaminophen (TYLENOL) 500 MG tablet Take 500-1,000 mg by mouth every 6 (six) hours as needed (for pain).    [provider]  clopidogrel (PLAVIX) 75 MG tablet TAKE 1 TABLET BY MOUTH ONCE DAILY. Patient not taking: No sig reported 09/11/16   Schnier, Dolores Lory, MD  co-enzyme Q-10 30 MG capsule Take 60 mg by mouth 3 (three) times daily. Patient not taking: Reported on 07/22/2021    [provider]  traMADol (ULTRAM) 50 MG tablet Take 2 tablets (100 mg total) by mouth every 6 (six) hours as needed for moderate pain. Patient not taking: Reported on 07/22/2021 06/16/16   Molly Huxley, MD    Physical Exam: Vitals:   05/23/22 2100 05/23/22 2115 05/23/22 2123 05/23/22 2330  BP: (!) 158/70 (!) 166/85 (!) 166/85   Pulse: 71 74 70 77  Resp:   20 18  Temp:   (!) 97.5 F (36.4 C)   TempSrc:   Oral   SpO2: 99% 97% 100% 98%  Weight:      Height:       Physical Exam Vitals and nursing note reviewed.  Constitutional:      General: She is not in acute distress.    Appearance: She is normal weight. She is not toxic-appearing.  HENT:     Head: Normocephalic and atraumatic.     Mouth/Throat:     Mouth: Mucous membranes are moist.     Pharynx: Oropharynx is clear.  Eyes:     Extraocular Movements: Extraocular movements intact.     Pupils: Pupils are equal, round, and reactive to light.  Cardiovascular:     Rate and Rhythm: Normal rate and regular rhythm.     Heart sounds: No murmur heard.    No gallop.  Pulmonary:     Effort: Pulmonary  effort is normal. No respiratory distress.     Breath sounds: Normal breath sounds. No wheezing, rhonchi or rales.  Abdominal:     General: Bowel sounds are normal.  Musculoskeletal:     Right lower leg: No edema.     Left lower leg: No edema.  Skin:    General: Skin is warm and dry.  Neurological:     Mental Status: She is alert.     Comments: Patient is alert and oriented only to self.  She is not oriented to place, time or situation.  Moving all extremities independently and against gravity.  Psychiatric:  Mood and Affect: Mood normal.        Behavior: Behavior normal.        Thought Content: Thought content normal.        Judgment: Judgment normal.    Data Reviewed: CBC with WBC of 7.2, hemoglobin of 10.1, platelets of 167.  CMP with potassium of 3.6, glucose of 113, creatinine of 0.72, GFR over 60.  CK elevated at 730.  CT Cervical Spine Wo Contrast  Result Date: 05/23/2022 CLINICAL DATA:  Neck trauma (Age >= 65y). Patient in recliner in triage with granddaughter who is also POA. Family states patient fell Saturday but did not want to be transported. Sunday fell again and helped herself up. Patient had been walking around the house EXAM: CT CERVICAL SPINE WITHOUT CONTRAST TECHNIQUE: Multidetector CT imaging of the cervical spine was performed without intravenous contrast. Multiplanar CT image reconstructions were also generated. RADIATION DOSE REDUCTION: This exam was performed according to the departmental dose-optimization program which includes automated exposure control, adjustment of the mA and/or kV according to patient size and/or use of iterative reconstruction technique. COMPARISON:  CT cervical spine 03/14/2022 FINDINGS: Alignment: Straightening of the normal cervical lordosis likely due to positioning and degenerative changes. Grade 1 anterolisthesis of C3 on C4. Mild retrolisthesis of C5 on C6. Skull base and vertebrae: Multilevel moderate degenerative changes of the  spine with moderate severe degenerative changes spine at the C5-C6 level. Associated right C5-C6 moderate severe osseous neural foraminal stenosis. No acute fracture. No aggressive appearing focal osseous lesion or focal pathologic process. Soft tissues and spinal canal: No prevertebral fluid or swelling. No visible canal hematoma. Upper chest: Unremarkable. Other: Atherosclerotic plaque of the aortic arch and its branches. IMPRESSION: 1. No acute displaced fracture or traumatic listhesis of the cervical spine. 2.  Aortic Atherosclerosis (ICD10-I70.0). Electronically Signed   By: Iven Finn M.D.   On: 05/23/2022 20:49   CT ABDOMEN PELVIS W CONTRAST  Result Date: 05/23/2022 CLINICAL DATA:  Apparent mass just to the left of the epigastrium on palpation of the abdomen. History of elevated bilirubin EXAM: CT ABDOMEN AND PELVIS WITH CONTRAST TECHNIQUE: Multidetector CT imaging of the abdomen and pelvis was performed using the standard protocol following bolus administration of intravenous contrast. RADIATION DOSE REDUCTION: This exam was performed according to the departmental dose-optimization program which includes automated exposure control, adjustment of the mA and/or kV according to patient size and/or use of iterative reconstruction technique. CONTRAST:  17m OMNIPAQUE IOHEXOL 300 MG/ML  SOLN COMPARISON:  None Available. FINDINGS: Lower chest: No acute abnormality. Coronary artery calcification. Tiny hiatal hernia. Hepatobiliary: No focal liver abnormality. Status post cholecystectomy. No biliary dilatation. Pancreas: Diffusely atrophic. No focal lesion. Otherwise normal pancreatic contour. No surrounding inflammatory changes. No main pancreatic ductal dilatation. Spleen: Normal in size without focal abnormality. Adrenals/Urinary Tract: No adrenal nodule bilaterally. Bilateral kidneys enhance symmetrically. No hydronephrosis. No hydroureter. The urinary bladder is unremarkable. Stomach/Bowel: Stomach is  within normal limits. No evidence of bowel wall thickening or dilatation. Colonic diverticulosis. Appendix appears normal. Vascular/Lymphatic: Interval increase in size of a 10.1 x 9.8 cm (from 8.6 x 8.6 cm) infrarenal abdominal aorta status post stent graft repair. Interval development of periaortic fat stranding. Marked heterogeneity within the excluded lumen suggestive of endoleak-limited evaluation on this portal venous phase. Repair extends from the level of the renal arteries with stents to the proximal renal arteries bilaterally down to the bilateral common iliacs. The suprarenal abdominal aorta is normal in caliber. Severe atherosclerotic plaque of  the aorta and its branches. No abdominal, pelvic, or inguinal lymphadenopathy. Reproductive: Uterus and bilateral adnexa are unremarkable. Other: No intraperitoneal free fluid. No intraperitoneal free gas. No organized fluid collection. Musculoskeletal: No abdominal wall hernia or abnormality. No suspicious lytic or blastic osseous lesions. No acute displaced fracture. Lumbar levoscoliosis with associated severe multilevel degenerative changes of the spine. IMPRESSION: 1. Concern for infrarenal abdominal aorta impending rupture given interval increase in size and periaortic fat stranding in the setting of a 10.1 x 9.8 cm aneurysm status post stent graft repair with associated endoleak. Limited evaluation on this portal venous phase. Recommend emergent vascular consultation. 2. Colonic diverticulosis with no acute diverticulitis. 3. Tiny hiatal hernia. 4. Lumbar levoscoliosis with associated severe multilevel degenerative changes of the spine. These results were called by telephone at the time of interpretation on 05/23/2022 at 8:33 pm to provider Conni Slipper , who verbally acknowledged these results. Electronically Signed   By: Iven Finn M.D.   On: 05/23/2022 20:42   CT HEAD WO CONTRAST (5MM)  Result Date: 05/23/2022 CLINICAL DATA:  Trauma, fall, altered  mental status EXAM: CT HEAD WITHOUT CONTRAST TECHNIQUE: Contiguous axial images were obtained from the base of the skull through the vertex without intravenous contrast. RADIATION DOSE REDUCTION: This exam was performed according to the departmental dose-optimization program which includes automated exposure control, adjustment of the mA and/or kV according to patient size and/or use of iterative reconstruction technique. COMPARISON:  03/14/2022 FINDINGS: Brain: No acute intracranial findings are seen. There are no signs of bleeding within the cranium. Cortical sulci are prominent. There is decreased density in periventricular and subcortical white matter. Vascular: There are scattered arterial calcifications. Skull: Unremarkable. Sinuses/Orbits: Unremarkable. Other: No significant interval changes are noted. IMPRESSION: No acute intracranial findings are seen in noncontrast CT brain. Atrophy. Small-vessel disease. Electronically Signed   By: Elmer Picker M.D.   On: 05/23/2022 14:35    There are no new results to review at this time.  Assessment and Plan: * End of life care Patient presenting after ground-level fall at home with altered mental status.  CT results demonstrates AAA with endoleak that has significantly enlarged and with findings concerning for impending rupture, including periaortic fat stranding.  Patient has previously declined any further surgeries and patient's granddaughter at bedside states patient would not want any further measures taken.  Molly Fisher would like to continue forward with comfort measures only.  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  Her grandmother has valued quality over quantity.  - Palliative care has been consulted; appreciate their recommendations - Comfort measures only in place - Morphine as needed for pain - Ativan as needed for anxiety  AAA (abdominal aortic aneurysm) without rupture  [facility]) Patient has a prior history of AAA s/p EVAR with prior history of endoleak that was stable.  CT imaging today demonstrates significant enlargement compared to prior with findings concerning for impending rupture.  Patient is comfort care only now.  Advance Care Planning:   Code Status: DNR   Consults: Palliative  Family Communication: Patient's granddaughter Molly Fisher updated at bedside  Severity of Illness: The appropriate patient status for this patient is OBSERVATION. Observation status is judged to be reasonable and necessary in order to provide the required intensity of service to ensure the patient's safety. The patient's presenting symptoms, physical exam findings, and initial radiographic and laboratory data in the context of their medical condition is felt  to place them at decreased risk for further clinical deterioration. Furthermore, it is anticipated that the patient will be medically stable for discharge from the hospital within 2 midnights of admission.   Author: Jose Persia, MD 05/24/2022 12:03 AM  For on call review [email or web address].

## 2022-05-23 NOTE — ED Notes (Signed)
Meds given  family with pt.

## 2022-05-23 NOTE — ED Notes (Signed)
Patient called at this time for updated VS. No answers from lobby.

## 2022-05-23 NOTE — ED Notes (Signed)
Pt with recent falls at home.  Lives alone  brought in by ems this am from home.  Family with pt.  Pt confused.  Iv in place.  Purewick in place.

## 2022-05-23 NOTE — ED Notes (Signed)
Chaplain in with pt and family.

## 2022-05-23 NOTE — ED Notes (Signed)
Pt moved to room 35 cpod   report off to rachel rn

## 2022-05-24 DIAGNOSIS — Z515 Encounter for palliative care: Secondary | ICD-10-CM | POA: Diagnosis present

## 2022-05-24 DIAGNOSIS — K219 Gastro-esophageal reflux disease without esophagitis: Secondary | ICD-10-CM | POA: Diagnosis present

## 2022-05-24 DIAGNOSIS — I10 Essential (primary) hypertension: Secondary | ICD-10-CM | POA: Diagnosis present

## 2022-05-24 DIAGNOSIS — F32A Depression, unspecified: Secondary | ICD-10-CM | POA: Diagnosis present

## 2022-05-24 DIAGNOSIS — R296 Repeated falls: Secondary | ICD-10-CM | POA: Diagnosis present

## 2022-05-24 DIAGNOSIS — M1990 Unspecified osteoarthritis, unspecified site: Secondary | ICD-10-CM | POA: Diagnosis present

## 2022-05-24 DIAGNOSIS — Z66 Do not resuscitate: Secondary | ICD-10-CM | POA: Diagnosis present

## 2022-05-24 DIAGNOSIS — Z85828 Personal history of other malignant neoplasm of skin: Secondary | ICD-10-CM | POA: Diagnosis not present

## 2022-05-24 DIAGNOSIS — Z9181 History of falling: Secondary | ICD-10-CM | POA: Diagnosis not present

## 2022-05-24 DIAGNOSIS — F411 Generalized anxiety disorder: Secondary | ICD-10-CM | POA: Diagnosis present

## 2022-05-24 DIAGNOSIS — M25552 Pain in left hip: Secondary | ICD-10-CM | POA: Diagnosis present

## 2022-05-24 DIAGNOSIS — Y92009 Unspecified place in unspecified non-institutional (private) residence as the place of occurrence of the external cause: Secondary | ICD-10-CM | POA: Diagnosis not present

## 2022-05-24 DIAGNOSIS — R1084 Generalized abdominal pain: Secondary | ICD-10-CM

## 2022-05-24 DIAGNOSIS — I714 Abdominal aortic aneurysm, without rupture, unspecified: Secondary | ICD-10-CM

## 2022-05-24 DIAGNOSIS — M25551 Pain in right hip: Secondary | ICD-10-CM | POA: Diagnosis present

## 2022-05-24 DIAGNOSIS — T82310D Breakdown (mechanical) of aortic (bifurcation) graft (replacement), subsequent encounter: Secondary | ICD-10-CM | POA: Diagnosis not present

## 2022-05-24 DIAGNOSIS — T796XXA Traumatic ischemia of muscle, initial encounter: Secondary | ICD-10-CM | POA: Diagnosis present

## 2022-05-24 DIAGNOSIS — R4182 Altered mental status, unspecified: Secondary | ICD-10-CM | POA: Diagnosis present

## 2022-05-24 DIAGNOSIS — W19XXXA Unspecified fall, initial encounter: Secondary | ICD-10-CM | POA: Diagnosis present

## 2022-05-24 DIAGNOSIS — E7800 Pure hypercholesterolemia, unspecified: Secondary | ICD-10-CM | POA: Diagnosis present

## 2022-05-24 DIAGNOSIS — W1830XA Fall on same level, unspecified, initial encounter: Secondary | ICD-10-CM | POA: Diagnosis present

## 2022-05-24 DIAGNOSIS — R7303 Prediabetes: Secondary | ICD-10-CM | POA: Diagnosis present

## 2022-05-24 MED ORDER — MORPHINE SULFATE (CONCENTRATE) 10 MG/0.5ML PO SOLN: 5.0000 mg | ORAL | Status: DC | PRN

## 2022-05-24 MED ORDER — MORPHINE BOLUS VIA INFUSION: 0.5000 mg | INTRAVENOUS | Status: DC | PRN

## 2022-05-24 MED ORDER — MORPHINE SULFATE (CONCENTRATE) 10 MG/0.5ML PO SOLN
5.0000 mg | ORAL | Status: AC
  Administered 2022-05-24: 5 mg via ORAL
  Filled 2022-05-24: qty 0.5

## 2022-05-24 MED ORDER — LORAZEPAM 2 MG/ML IJ SOLN: 0.5000 mg | INTRAMUSCULAR | Status: DC | PRN

## 2022-05-24 MED ORDER — LORAZEPAM 2 MG/ML IJ SOLN: 0.5000 mg | INTRAMUSCULAR | Status: DC

## 2022-05-24 MED ORDER — MORPHINE 100MG IN NS 100ML (1MG/ML) PREMIX INFUSION
1.0000 mg/h | INTRAVENOUS | Status: DC
  Administered 2022-05-24: 1 mg/h via INTRAVENOUS
  Filled 2022-05-24: qty 100

## 2022-05-24 NOTE — TOC Initial Note (Addendum)
Transition of Care [facility]) - Initial/Assessment Note    Patient Details  Name: FOREST PRUDEN MRN: [id number] Date of Birth: 10/24/1930  Transition of [facility]) CM/SW Contact:    Laurena Slimmer, RN Phone Number: 05/24/2022, 3:31 PM  Clinical Narrative:                 Patient on comfort care.           Patient Goals and CMS Choice        Expected Discharge Plan and Services                                                Prior Living Arrangements/Services                       Activities of Daily Living      Permission Sought/Granted                  Emotional Assessment              Admission diagnosis:  End of life care [Z51.5] Abdominal aortic aneurysm (AAA) greater than 5.0 cm in diameter in female [facility]) [I71.40] Patient Active Problem List   Diagnosis Date Noted   End of life care 05/23/2022   Pain in limb 10/27/2016   Back pain 05/26/2016   Hyperlipidemia 05/26/2016   AAA (abdominal aortic aneurysm) without rupture ([place]) 05/26/2016   S/P total knee arthroplasty 08/25/2015   PCP:  Kirk Ruths, MD Pharmacy:   [place], [place] [address] [place] [address] Phone: [phone] Fax: [phone]     Social Determinants of Health (SDOH) Interventions    Readmission Risk Interventions     No data to display

## 2022-05-24 NOTE — Consult Note (Signed)
Consultation Note Date: 05/24/2022   Patient Name: Molly Fisher  DOB: 06-Oct-1930  MRN: [id number]  Age / Sex: 88 y.o., female  PCP: Kirk Ruths, MD Referring Physician: Enzo Bi, MD  Reason for Consultation: Establishing goals of care and Psychosocial/spiritual support  HPI/Patient Profile: 88 y.o. female  admitted on 05/23/2022 with past  medical history significant of AAA s/p EVAR complicated by endoleak, hypertension, hyperlipidemia, generalized anxiety, prediabetes, who presents to the ED with complaints of ground-level fall and altered mental status.     Family report several falls over the last few days.  She fell on 12/2, 12/3 and fell again on 12/4 and was unable to stand up on her own.  Through the night on 12/4 into the morning of 12/5, she had several episodes of urinary and bowel incontinence due to inability to stand up to use the restroom.    ED course: On arrival to the ED, patient was hypertensive at 172/70 with heart rate of 72.  She was saturating at 97% on room air. Initial workup remarkable for CK of 730, hemoglobin of 10.1.    CT abdomen/pelvis was obtained that demonstrated abdominal aorta aneurysm with impending rupture given interval increase in size and periaortic fat stranding.    These results were discussed with patient's granddaughter and primary decision maker [employer]  .  Due to patient's advanced age and prior reluctance towards endoleak repair, comfort measures only were requested.  Admitted for  for end-of-life care.     Clinical Assessment and Goals of Care:   This NP Wadie Lessen reviewed medical records, received report from team, assessed the patient and then meet at the patient's bedside  [facility]/ Granddaughter Suezanne Cheshire decision maker to discuss diagnosis, prognosis, GOC, EOL wishes disposition and options.   Concept of Palliative Care was  introduced as specialized medical care for people and their families living with serious illness.  If focuses on providing relief from the symptoms and stress of a serious illness.  The goal is to improve quality of life for both the patient and the family. Values and goals of care important to patient and family were attempted to be elicited.  Larene Beach is very familiar with Hospice and Palliative Medicine working in the filed for many years.   Created space and opportunity for family to explore thoughts and feelings regarding current medical situation.  Family at bedside understand the seriousness of the current medical situation and the associated limited prognosis.  Granddaughter has had many conversations with this patient regarding end-of-life wishes.  It was decided 9 months ago not to proceed with aneurysm repair, making the most of what ever time she was given.  Patient has remained independent and living in her own home until this admission.  Focus of care is comfort, quality and dignity; allowing for a  natural death       The  difference between a aggressive medical intervention path  and a palliative comfort care path for this patient at this time was detailed .  Natural trajectory and expectations at EOL were discussed.    Questions and concerns addressed.  Family encouraged to call with questions or concerns.     PMT will continue to support holistically.      SUMMARY OF RECOMMENDATIONS    Code Status/Advance Care Planning: DNR   Symptom Management:  Pain and agitation:            -Leave Foley catheter for end-of-life care            -Continuous morphine drip with bolus option- initiate asap             -Ativan IV 0.5 mg every 4 hrs prn   Palliative Prophylaxis:  Aspiration, Bowel Regimen, Frequent Pain Assessment, and Oral Care  Additional Recommendations (Limitations, Scope, Preferences): Full Comfort Care  Psycho-social/Spiritual:  Desire for further  Chaplaincy support:no-declined Additional Recommendations: Education on Hospice  Prognosis:  Hours - Days  Discharge Planning  Reassess in the morning for recommendation on transition of care  To Be Determined      Primary Diagnoses: Present on Admission:  AAA (abdominal aortic aneurysm) without rupture ([place])   I have reviewed the medical record, interviewed the patient and family, and examined the patient. The following aspects are pertinent.  Past Medical History:  Diagnosis Date   Anxiety    Arthritis    Cancer ([place])    squamous cell skin cancer   Depression    GERD (gastroesophageal reflux disease)    Hypercholesteremia    Lumbar pain    protrusion and stenosis   Social History   Socioeconomic History   Marital status: Widowed    Spouse name: Not on file   Number of children: Not on file   Years of education: Not on file   Highest education level: Not on file  Occupational History   Not on file  Tobacco Use   Smoking status: Never   Smokeless tobacco: Never  Substance and Sexual Activity   Alcohol use: No   Drug use: No   Sexual activity: Not on file  Other Topics Concern   Not on file  Social History Narrative   Not on file   Social Determinants of Health   Financial Resource Strain: Not on file  Food Insecurity: Not on file  Transportation Needs: Not on file  Physical Activity: Not on file  Stress: Not on file  Social Connections: Not on file   Family History  Problem Relation Age of Onset   Aortic aneurysm Mother    Cancer Father    Scheduled Meds: Continuous Infusions: PRN Meds:.acetaminophen **OR** acetaminophen, antiseptic oral rinse, LORazepam **OR** LORazepam **OR** LORazepam, morphine injection, ondansetron **OR** ondansetron (ZOFRAN) IV, polyvinyl alcohol Medications Prior to Admission:  Prior to Admission medications   Medication Sig Start Date End Date Taking? Authorizing Provider  Ascorbic Acid (VITAMIN C) 1000 MG tablet Take  1,000 mg by mouth daily.   Yes [provider]  Biotin 10000 MCG TABS Take 10,000 mcg by mouth daily.   Yes [provider]  cholecalciferol (VITAMIN D3) 25 MCG (1000 UNIT) tablet Take 1,000 Units by mouth daily. 04/21/22  Yes [provider]  escitalopram (LEXAPRO) 10 MG tablet Take 10 mg by mouth daily. 07/11/21  Yes [provider]  GNP ASPIRIN LOW DOSE 81 MG EC tablet TAKE 1 TABLET BY MOUTH ONCE DAILY. 03/12/17  Yes Dew, Erskine Squibb, MD  rosuvastatin (CRESTOR) 5 MG tablet Take 5 mg by mouth daily.   Yes [provider]  vitamin B-12 (CYANOCOBALAMIN) 1000 MCG tablet Take 1,000 mcg by mouth daily.   Yes [provider]  acetaminophen (TYLENOL) 500 MG tablet Take 500-1,000 mg by mouth every 6 (six) hours as needed (for pain).    [provider]  clopidogrel (PLAVIX) 75 MG tablet TAKE 1 TABLET BY MOUTH ONCE DAILY. Patient not taking: No sig reported 09/11/16   Schnier, Dolores Lory, MD  co-enzyme Q-10 30 MG capsule Take 60 mg by mouth 3 (three) times daily. Patient not taking: Reported on 07/22/2021    [provider]  traMADol (ULTRAM) 50 MG tablet Take 2 tablets (100 mg total) by mouth every 6 (six) hours as needed for moderate pain. Patient not taking: Reported on 07/22/2021 06/16/16   Algernon Huxley, MD   Allergies  Allergen Reactions   Statins Other (See Comments)    Cramping in her legs and uneasy feeling.   Review of Systems  Unable to perform ROS: Acuity of condition    Physical Exam Constitutional:      Appearance: She is underweight. She is ill-appearing.  Cardiovascular:     Rate and Rhythm: Normal rate.  Abdominal:     General: There is distension.     Tenderness: There is abdominal tenderness.  Skin:    General: Skin is warm.  Neurological:     Mental Status: She is lethargic.    Vital Signs: BP (!) 147/66 (BP Location: Right Arm)   Pulse 72   Temp 97.7 F (36.5 C)   Resp 16   Ht '5\' 6"'$  (1.676 m)   Wt 66.1  kg   SpO2 100%   BMI 23.52 kg/m  Pain Scale: 0-10   Pain Score: 0-No pain   SpO2: SpO2: 100 % O2 Device:SpO2: 100 % O2 Flow Rate: .   IO: Intake/output summary:  Intake/Output Summary (Last 24 hours) at 05/24/2022 0818 Last data filed at 05/24/2022 0522 Gross per 24 hour  Intake --  Output 0 ml  Net 0 ml    LBM:   Baseline Weight: Weight: 72.6 kg Most recent weight: Weight: 66.1 kg     Palliative Assessment/Data 20%   Discussed with Dr  Billie Ruddy and bedside RN   Time In: 1030 Time Out: 1145  Time Total: 75 minutes Greater than 50%  of this time was spent counseling and coordinating care related to the above assessment and plan.  Signed by: Wadie Lessen, NP   Please contact Palliative Medicine Team phone at [phone] for questions and concerns.  For individual provider: See Shea Evans

## 2022-05-24 NOTE — Assessment & Plan Note (Signed)
Patient has a prior history of AAA s/p EVAR with prior history of endoleak that was stable.  CT imaging today demonstrates significant enlargement compared to prior with findings concerning for impending rupture.  Patient is comfort care only now.

## 2022-05-24 NOTE — Assessment & Plan Note (Signed)
Patient presenting after ground-level fall at home with altered mental status.  CT results demonstrates AAA with endoleak that has significantly enlarged and with findings concerning for impending rupture, including periaortic fat stranding.  Patient has previously declined any further surgeries and patient's granddaughter at bedside states patient would not want any further measures taken.  Larene Beach would like to continue forward with comfort measures only.  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  Her grandmother has valued quality over quantity.  - Palliative care has been consulted; appreciate their recommendations - Comfort measures only in place - Morphine as needed for pain - Ativan as needed for anxiety

## 2022-05-24 NOTE — Progress Notes (Signed)
PROGRESS NOTE    Molly Fisher  XIP:[id number] DOB: February 16, 1931 DOA: 05/23/2022 PCP: Kirk Ruths, MD  131A/131A-BB  LOS: 0 days   Brief hospital course:   Assessment & Plan: Molly Fisher is a 88 y.o. female with medical history significant of AAA s/p EVAR complicated by endoleak, hypertension, hyperlipidemia, generalized anxiety, prediabetes, who presents to the ED with complaints of ground-level fall and altered mental status.  History obtained from patient's granddaughter Molly Fisher at bedside due to patient's altered mental status.   Molly Fisher states that Molly Fisher has had several falls over the last few days.  She fell on 12/2, 12/3 and fell again on 12/4 and was unable to stand up on her own.  Through the night on 12/4 into the morning of 12/5, she had several episodes of urinary and bowel incontinence due to inability to stand up to use the restroom.  Today when Molly Fisher went to go check on her grandmother, she found Molly Fisher on the ground and altered.  She notes that Molly Fisher has a history of confusion at times but no history of dementia.  She has seemed more confused in the last few days.  In addition, she states Molly Fisher has been complaining of abdominal pain and bilateral hip pain.  * Comfort care status Patient presenting after ground-level fall at home with altered mental status.  CT results demonstrates AAA with endoleak that has significantly enlarged and with findings concerning for impending rupture, including periaortic fat stranding.  Patient has previously declined any further surgeries and patient's granddaughter at bedside states patient would not want any further measures taken.  Molly Fisher would like to continue forward with comfort measures only.  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  Her grandmother has valued quality over quantity. - Palliative care consult - Comfort measures only  in place - Morphine as needed for pain - Ativan as needed for anxiety   AAA (abdominal aortic aneurysm) without rupture [facility]) Patient has a prior history of AAA s/p EVAR with prior history of endoleak that was stable.  CT imaging today demonstrates significant enlargement compared to prior with findings concerning for impending rupture.  Patient is comfort care only now.  Traumatic rhabdo --CK 730.  Pt was found down on the ground. --No IVF.  comfort care only    DVT prophylaxis: None:Comfort Care Code Status: DNR  Family Communication: granddaughter updated at bedside today Level of care: Med-Surg Dispo:   The patient is from: home Anticipated d/c is to: to be determined Anticipated d/c date is: to be determined   Subjective and Interval History:  Pt was made comfort care and given morphine.  Granddaughter reported pt was sleeping peacefully.   Objective: Vitals:   05/24/22 0229 05/24/22 0230 05/24/22 0739 05/24/22 1637  BP:  (!) 123/54 (!) 147/66 (!) 147/60  Pulse:  69 72 65  Resp:  '17 16 16  '$ Temp:  98 F (36.7 C) 97.7 F (36.5 C) 97.7 F (36.5 C)  TempSrc: Axillary     SpO2:  95% 100% 100%  Weight:  66.1 kg    Height:        Intake/Output Summary (Last 24 hours) at 05/24/2022 2026 Last data filed at 05/24/2022 1845 Gross per 24 hour  Intake 0 ml  Output 800 ml  Net -800 ml   Filed Weights   05/23/22 1243 05/24/22 0230  Weight: 72.6 kg 66.1  kg    Examination:   Constitutional: NAD, sleeping CV: No cyanosis.   RESP: normal respiratory effort, on RA   Data Reviewed: I have personally reviewed labs and imaging studies  Time spent: 35 minutes  Enzo Bi, MD [facility] If 7PM-7AM, please contact night-coverage 05/24/2022, 8:26 PM

## 2022-05-25 DIAGNOSIS — Z515 Encounter for palliative care: Secondary | ICD-10-CM | POA: Diagnosis not present

## 2022-05-25 DIAGNOSIS — I714 Abdominal aortic aneurysm, without rupture, unspecified: Secondary | ICD-10-CM | POA: Diagnosis not present

## 2022-05-25 DIAGNOSIS — R1084 Generalized abdominal pain: Secondary | ICD-10-CM | POA: Diagnosis not present

## 2022-05-25 NOTE — TOC Progression Note (Signed)
Transition of Care [facility]) - Progression Note    Patient Details  Name: Molly Fisher MRN: [id number] Date of Birth: Dec 15, 1930  [facility] CM/SW Contact  Laurena Slimmer, RN Phone Number: 05/25/2022, 11:36 AM  Clinical Narrative:    Received a message from Jhonnie Garner at [facility] stated patient's granddaughter had called them for a referral to the hospice home. MD notified.         Expected Discharge Plan and Services                                                 Social Determinants of Health (SDOH) Interventions    Readmission Risk Interventions     No data to display

## 2022-05-25 NOTE — Care Management Important Message (Signed)
Important Message  Patient Details  Name: Molly Fisher MRN: [id number] Date of Birth: [birth year]   Medicare Important Message Given:  N/A - LOS <3 / Initial given by admissions     Dannette Barbara 05/25/2022, 3:07 PM

## 2022-05-25 NOTE — Progress Notes (Signed)
PROGRESS NOTE    Molly Fisher  OZH:[id number] DOB: 1931-04-11 DOA: 05/23/2022 PCP: Kirk Ruths, MD  131A/131A-BB  LOS: 1 day   Brief hospital course:   Assessment & Plan: Molly Fisher is a 88 y.o. female with medical history significant of AAA s/p EVAR complicated by endoleak, hypertension, hyperlipidemia, generalized anxiety, prediabetes, who presents to the ED with complaints of ground-level fall and altered mental status.  History obtained from patient's granddaughter Molly Fisher at bedside due to patient's altered mental status.   Molly Fisher states that Molly Fisher has had several falls over the last few days.  She fell on 12/2, 12/3 and fell again on 12/4 and was unable to stand up on her own.  Through the night on 12/4 into the morning of 12/5, she had several episodes of urinary and bowel incontinence due to inability to stand up to use the restroom.  Today when Molly Fisher went to go check on her grandmother, she found Molly Fisher on the ground and altered.  She notes that Molly Fisher has a history of confusion at times but no history of dementia.  She has seemed more confused in the last few days.  In addition, she states Molly Fisher has been complaining of abdominal Fisher and bilateral hip Fisher.  * Comfort care status Patient presenting after ground-level fall at home with altered mental status.  CT results demonstrates AAA with endoleak that has significantly enlarged and with findings concerning for impending rupture, including periaortic fat stranding.  Patient has previously declined any further surgeries and patient's granddaughter at bedside states patient would not want any further measures taken.  Molly Fisher would like to continue forward with comfort measures only.  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  Her grandmother has valued quality over quantity. - Palliative care consulted - Comfort measures only  in place - Morphine gtt - Ativan as needed for anxiety --discharge to hospice facility --keep Foley for comfort   AAA (abdominal aortic aneurysm) without rupture [facility]) Patient has a prior history of AAA s/p EVAR with prior history of endoleak that was stable.  CT imaging today demonstrates significant enlargement compared to prior with findings concerning for impending rupture.  Patient is comfort care only now.  Traumatic rhabdo --CK 730.  Pt was found down on the ground. --No IVF.  comfort care only    DVT prophylaxis: None:Comfort Care Code Status: DNR  Family Communication: granddaughter updated at bedside today Level of care: Med-Surg Dispo:   The patient is from: home Anticipated d/c is to: hospice facility Anticipated d/c date is: tomorrow   Subjective and Interval History:  Pt most slept on morphine gtt, but was arousable.    Hospice facility referral made today.   Objective: Vitals:   05/24/22 0230 05/24/22 0739 05/24/22 1637 05/25/22 0748  BP: (!) 123/54 (!) 147/66 (!) 147/60 (!) 147/66  Pulse: 69 72 65 64  Resp: '17 16 16 16  '$ Temp: 98 F (36.7 C) 97.7 F (36.5 C) 97.7 F (36.5 C) (!) 97.5 F (36.4 C)  TempSrc:      SpO2: 95% 100% 100% 96%  Weight: 66.1 kg     Height:        Intake/Output Summary (Last 24 hours) at 05/25/2022 1920 Last data filed at 05/25/2022 1800 Gross per 24 hour  Intake 10.88 ml  Output 400 ml  Net -389.12 ml   [employer]  05/23/22 1243 05/24/22 0230  Weight: 72.6 kg 66.1 kg    Examination:   Constitutional: NAD, sleeping CV: No cyanosis.   RESP: normal respiratory effort, on RA Foley present   Data Reviewed: I have personally reviewed labs and imaging studies  Time spent: 25 minutes  Enzo Bi, MD [facility] If 7PM-7AM, please contact night-coverage 05/25/2022, 7:20 PM

## 2022-05-25 NOTE — Progress Notes (Signed)
Patient ID: [name], female   DOB: 19-Jul-1930, 88 y.o.   MRN: [id number]    Progress Note from the Palliative Medicine Team at [facility]   Patient Name: Molly Fisher        Date: 05/25/2022 DOB: 09-22-1930  Age: 88 y.o. MRN#: [id number] Attending Physician: Enzo Bi, MD Primary Care Physician: Kirk Ruths, MD Admit Date: 05/23/2022   Medical records reviewed, assessed the patient, discussed with grand-daughter at bedside     88 y.o. female  admitted on 05/23/2022 with past  medical history significant of AAA s/p EVAR complicated by endoleak, hypertension, hyperlipidemia, generalized anxiety, prediabetes, who presents to the ED with complaints of ground-level fall and altered mental status.      Family report several falls over the last few days.  She fell on 12/2, 12/3 and fell again on 12/4 and was unable to stand up on her own.  Through the night on 12/4 into the morning of 12/5, she had several episodes of urinary and bowel incontinence due to inability to stand up to use the restroom.     ED course: On arrival to the ED, patient was hypertensive at 172/70 with heart rate of 72.  She was saturating at 97% on room air. Initial workup remarkable for CK of 730, hemoglobin of 10.1.     CT abdomen/pelvis was obtained that demonstrated abdominal aorta aneurysm with impending rupture given interval increase in size     These results were discussed with patient's granddaughter and primary decision maker [employer]  .  Due to patient's advanced age and prior reluctance towards endoleak repair, comfort measures only were requested.  Admitted for  for end-of-life care.    This NP assessed patient at the bedside as a follow up to  yesterday's [place], slowly arouses to verbal and tackle stimulation  Plan of care    Focus of care is comfort and dignity at end-of-life -DNR/DNI -No artificial feeding or hydration now or in the future -No antibiotic use, diagnostics or  interventions -Symptom management       -Currently on continuous  morphine drip with bolus        -Ativan IV  0.5 mg every 4 hours prn  -Hopeful to transition to residential hospice for end-of-life care - referral has been placed   Education offered on residential hospice eligibility   Education offered on the natural trajectory and expectations at end-of-life   Education offered today regarding  the importance of continued conversation with family and their  medical providers regarding overall plan of care and treatment options,  ensuring decisions are within the context of the patients values and GOCs.  Questions and concerns addressed   Discussed with hospice liaison   50 minutes  Wadie Lessen NP  Palliative Medicine Team Team Phone # [phone] Pager [phone]

## 2022-05-26 DIAGNOSIS — Z515 Encounter for palliative care: Secondary | ICD-10-CM | POA: Diagnosis not present

## 2022-05-26 MED ORDER — LORAZEPAM 2 MG/ML IJ SOLN: 0.5000 mg | INTRAMUSCULAR | 0 refills | Status: DC | PRN

## 2022-05-26 MED ORDER — MORPHINE 100MG IN NS 100ML (1MG/ML) PREMIX INFUSION: 1.0000 mg/h | INTRAVENOUS | 0 refills | Status: DC

## 2022-05-26 MED ORDER — POLYVINYL ALCOHOL 1.4 % OP SOLN: 1.0000 [drp] | Freq: Four times a day (QID) | OPHTHALMIC | 0 refills | Status: DC | PRN

## 2022-05-26 MED ORDER — BIOTENE DRY MOUTH MT LIQD: 15.0000 mL | OROMUCOSAL | Status: DC | PRN

## 2022-05-26 NOTE — Discharge Summary (Signed)
Physician Discharge Summary   Molly Fisher  female DOB: 21-Dec-1930  VOJ:[id number]  PCP: Kirk Ruths, MD  Admit date: 05/23/2022 Discharge date: 05/26/2022  Admitted From: home Disposition:  hospice facility CODE STATUS: DNR   Hospital Course:  For full details, please see H&P, progress notes, consult notes and ancillary notes.  Briefly,  Molly Fisher is a 88 y.o. female with medical history significant of AAA s/p EVAR complicated by endoleak, hypertension, hyperlipidemia, generalized anxiety, prediabetes, who presented to the ED with complaints of ground-level fall and altered mental status.  History obtained from patient's granddaughter Larene Beach at bedside due to patient's altered mental status.   Larene Beach states that Mrs. Karam has had several falls over the last few days.  She fell on 12/2, 12/3 and fell again on 12/4 and was unable to stand up on her own.  Through the night on 12/4 into the morning of 12/5, she had several episodes of urinary and bowel incontinence due to inability to stand up to use the restroom.  when Larene Beach went to go check on her grandmother, she found Mrs. Offutt on the ground and altered.  Mrs. Santino has a history of confusion at times but no history of dementia.  Pt seemed more confused in the last few days.  In addition, she states Mrs. Chrestman has been complaining of abdominal pain and bilateral hip pain.   * Comfort care status CT results demonstrates AAA with endoleak that has significantly enlarged and with findings concerning for impending rupture, including periaortic fat stranding.  Patient has previously declined any further surgeries and patient's granddaughter at bedside states patient would not want any further measures taken.  Larene Beach decided on comfort measures only.  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  Her grandmother has valued quality over quantity. --Pt  received morphine gtt during hospitalization, and Foley for comfort. --discharged to hospice facility   AAA (abdominal aortic aneurysm) without rupture [facility]) Patient has a prior history of AAA s/p EVAR with prior history of endoleak that was stable.  Current CT imaging however demonstrated significant enlargement compared to prior with findings concerning for impending rupture.  Patient was comfort care only now.   Traumatic rhabdo --CK 730.  Pt was found down on the ground. --No IVF.  comfort care only  Falls --CT a/p and CT cervical spine found no acute fractures.    Discharge Diagnoses:  Principal Problem:   End of life care Active Problems:   AAA (abdominal aortic aneurysm) without rupture ([place])   AAA (abdominal aortic aneurysm) ([place])   30 Day Unplanned Readmission Risk Score    Flowsheet Row ED to Hosp-Admission (Current) from 05/23/2022 in [place] (1A)  30 Day Unplanned Readmission Risk Score (%) 9.14 Filed at 05/26/2022 0400       This score is the patient's risk of an unplanned readmission within 30 days of being discharged (0 -100%). The score is based on dignosis, age, lab data, medications, orders, and past utilization.   Low:  0-14.9   Medium: 15-21.9   High: 22-29.9   Extreme: 30 and above         Discharge Instructions:  Allergies as of 05/26/2022       Reactions   Statins Other (See Comments)   Cramping in her legs and uneasy feeling.        Medication List     STOP  taking these medications    acetaminophen 500 MG tablet Commonly known as: TYLENOL   Biotin 10000 MCG Tabs   cholecalciferol 25 MCG (1000 UNIT) tablet Commonly known as: VITAMIN D3   clopidogrel 75 MG tablet Commonly known as: PLAVIX   co-enzyme Q-10 30 MG capsule   cyanocobalamin 1000 MCG tablet Commonly known as: VITAMIN B12   escitalopram 10 MG tablet Commonly known as: LEXAPRO   GNP Aspirin Low Dose 81 MG tablet Generic drug:  aspirin EC   rosuvastatin 5 MG tablet Commonly known as: CRESTOR   traMADol 50 MG tablet Commonly known as: Ultram   vitamin C 1000 MG tablet       TAKE these medications    antiseptic oral rinse Liqd Apply 15 mLs topically as needed for dry mouth.   LORazepam 2 MG/ML injection Commonly known as: ATIVAN Inject 0.25 mLs (0.5 mg total) into the vein every 4 (four) hours as needed for anxiety.   morphine 1 mg/mL Soln infusion Inject 1-10 mg/hr into the vein continuous.   polyvinyl alcohol 1.4 % ophthalmic solution Commonly known as: LIQUIFILM TEARS Place 1 drop into both eyes 4 (four) times daily as needed for dry eyes.          Allergies  Allergen Reactions   Statins Other (See Comments)    Cramping in her legs and uneasy feeling.     The results of significant diagnostics from this hospitalization (including imaging, microbiology, ancillary and laboratory) are listed below for reference.   Consultations:   Procedures/Studies: CT Cervical Spine Wo Contrast  Result Date: 05/23/2022 CLINICAL DATA:  Neck trauma (Age >= 65y). Patient in recliner in triage with granddaughter who is also POA. Family states patient fell Saturday but did not want to be transported. Sunday fell again and helped herself up. Patient had been walking around the house EXAM: CT CERVICAL SPINE WITHOUT CONTRAST TECHNIQUE: Multidetector CT imaging of the cervical spine was performed without intravenous contrast. Multiplanar CT image reconstructions were also generated. RADIATION DOSE REDUCTION: This exam was performed according to the departmental dose-optimization program which includes automated exposure control, adjustment of the mA and/or kV according to patient size and/or use of iterative reconstruction technique. COMPARISON:  CT cervical spine 03/14/2022 FINDINGS: Alignment: Straightening of the normal cervical lordosis likely due to positioning and degenerative changes. Grade 1 anterolisthesis  of C3 on C4. Mild retrolisthesis of C5 on C6. Skull base and vertebrae: Multilevel moderate degenerative changes of the spine with moderate severe degenerative changes spine at the C5-C6 level. Associated right C5-C6 moderate severe osseous neural foraminal stenosis. No acute fracture. No aggressive appearing focal osseous lesion or focal pathologic process. Soft tissues and spinal canal: No prevertebral fluid or swelling. No visible canal hematoma. Upper chest: Unremarkable. Other: Atherosclerotic plaque of the aortic arch and its branches. IMPRESSION: 1. No acute displaced fracture or traumatic listhesis of the cervical spine. 2.  Aortic Atherosclerosis (ICD10-I70.0). Electronically Signed   By: Iven Finn M.D.   On: 05/23/2022 20:49   CT ABDOMEN PELVIS W CONTRAST  Result Date: 05/23/2022 CLINICAL DATA:  Apparent mass just to the left of the epigastrium on palpation of the abdomen. History of elevated bilirubin EXAM: CT ABDOMEN AND PELVIS WITH CONTRAST TECHNIQUE: Multidetector CT imaging of the abdomen and pelvis was performed using the standard protocol following bolus administration of intravenous contrast. RADIATION DOSE REDUCTION: This exam was performed according to the departmental dose-optimization program which includes automated exposure control, adjustment of the mA and/or kV according  to patient size and/or use of iterative reconstruction technique. CONTRAST:  19m OMNIPAQUE IOHEXOL 300 MG/ML  SOLN COMPARISON:  None Available. FINDINGS: Lower chest: No acute abnormality. Coronary artery calcification. Tiny hiatal hernia. Hepatobiliary: No focal liver abnormality. Status post cholecystectomy. No biliary dilatation. Pancreas: Diffusely atrophic. No focal lesion. Otherwise normal pancreatic contour. No surrounding inflammatory changes. No main pancreatic ductal dilatation. Spleen: Normal in size without focal abnormality. Adrenals/Urinary Tract: No adrenal nodule bilaterally. Bilateral kidneys  enhance symmetrically. No hydronephrosis. No hydroureter. The urinary bladder is unremarkable. Stomach/Bowel: Stomach is within normal limits. No evidence of bowel wall thickening or dilatation. Colonic diverticulosis. Appendix appears normal. Vascular/Lymphatic: Interval increase in size of a 10.1 x 9.8 cm (from 8.6 x 8.6 cm) infrarenal abdominal aorta status post stent graft repair. Interval development of periaortic fat stranding. Marked heterogeneity within the excluded lumen suggestive of endoleak-limited evaluation on this portal venous phase. Repair extends from the level of the renal arteries with stents to the proximal renal arteries bilaterally down to the bilateral common iliacs. The suprarenal abdominal aorta is normal in caliber. Severe atherosclerotic plaque of the aorta and its branches. No abdominal, pelvic, or inguinal lymphadenopathy. Reproductive: Uterus and bilateral adnexa are unremarkable. Other: No intraperitoneal free fluid. No intraperitoneal free gas. No organized fluid collection. Musculoskeletal: No abdominal wall hernia or abnormality. No suspicious lytic or blastic osseous lesions. No acute displaced fracture. Lumbar levoscoliosis with associated severe multilevel degenerative changes of the spine. IMPRESSION: 1. Concern for infrarenal abdominal aorta impending rupture given interval increase in size and periaortic fat stranding in the setting of a 10.1 x 9.8 cm aneurysm status post stent graft repair with associated endoleak. Limited evaluation on this portal venous phase. Recommend emergent vascular consultation. 2. Colonic diverticulosis with no acute diverticulitis. 3. Tiny hiatal hernia. 4. Lumbar levoscoliosis with associated severe multilevel degenerative changes of the spine. These results were called by telephone at the time of interpretation on 05/23/2022 at 8:33 pm to provider PConni Slipper, who verbally acknowledged these results. Electronically Signed   By: MIven Finn M.D.   On: 05/23/2022 20:42   CT HEAD WO CONTRAST (5MM)  Result Date: 05/23/2022 CLINICAL DATA:  Trauma, fall, altered mental status EXAM: CT HEAD WITHOUT CONTRAST TECHNIQUE: Contiguous axial images were obtained from the base of the skull through the vertex without intravenous contrast. RADIATION DOSE REDUCTION: This exam was performed according to the departmental dose-optimization program which includes automated exposure control, adjustment of the mA and/or kV according to patient size and/or use of iterative reconstruction technique. COMPARISON:  03/14/2022 FINDINGS: Brain: No acute intracranial findings are seen. There are no signs of bleeding within the cranium. Cortical sulci are prominent. There is decreased density in periventricular and subcortical white matter. Vascular: There are scattered arterial calcifications. Skull: Unremarkable. Sinuses/Orbits: Unremarkable. Other: No significant interval changes are noted. IMPRESSION: No acute intracranial findings are seen in noncontrast CT brain. Atrophy. Small-vessel disease. Electronically Signed   By: PElmer PickerM.D.   On: 05/23/2022 14:35   UKoreaAbdomen Limited RUQ (LIVER/GB)  Result Date: 05/16/2022 CLINICAL DATA:  Elevated bilirubin.  Status post cholecystectomy. EXAM: ULTRASOUND ABDOMEN LIMITED RIGHT UPPER QUADRANT COMPARISON:  None Available. FINDINGS: Gallbladder: Surgically absent Common bile duct: Diameter: 1.5 mm Liver: Mildly heterogeneous echogenicity. No focal mass. Portal vein is patent on color Doppler imaging with normal direction of blood flow towards the liver. Other: None. IMPRESSION: No cause for elevated bilirubin identified. Patient is status post cholecystectomy. Common bile duct is normal  in caliber. The liver is unremarkable on today's study. Electronically Signed   By: Dorise Bullion III M.D.   On: 05/16/2022 13:42      Labs: BNP (last 3 results) No results for input(s): "BNP" in the last 8760 hours. Basic  Metabolic Panel: Recent Labs  Lab 05/23/22 1247  NA 143  K 3.6  CL 110  CO2 24  GLUCOSE 113*  BUN 15  CREATININE 0.72  CALCIUM 9.3   Liver Function Tests: Recent Labs  Lab 05/23/22 1247  AST 39  ALT 19  ALKPHOS 57  BILITOT 0.9  PROT 6.6  ALBUMIN 3.6   No results for input(s): "LIPASE", "AMYLASE" in the last 168 hours. No results for input(s): "AMMONIA" in the last 168 hours. CBC: Recent Labs  Lab 05/23/22 1247  WBC 7.2  HGB 10.1*  HCT 32.8*  MCV 86.1  PLT 167   Cardiac Enzymes: Recent Labs  Lab 05/23/22 1247  CKTOTAL 730*   BNP: Invalid input(s): "POCBNP" CBG: Recent Labs  Lab 05/23/22 1249  GLUCAP 102*   D-Dimer No results for input(s): "DDIMER" in the last 72 hours. Hgb A1c No results for input(s): "HGBA1C" in the last 72 hours. Lipid Profile No results for input(s): "CHOL", "HDL", "LDLCALC", "TRIG", "CHOLHDL", "LDLDIRECT" in the last 72 hours. Thyroid function studies No results for input(s): "TSH", "T4TOTAL", "T3FREE", "THYROIDAB" in the last 72 hours.  Invalid input(s): "FREET3" Anemia work up No results for input(s): "VITAMINB12", "FOLATE", "FERRITIN", "TIBC", "IRON", "RETICCTPCT" in the last 72 hours. Urinalysis    Component Value Date/Time   COLORURINE YELLOW (A) 08/11/2015 1408   APPEARANCEUR HAZY (A) 08/11/2015 1408   LABSPEC 1.021 08/11/2015 1408   PHURINE 6.0 08/11/2015 1408   GLUCOSEU NEGATIVE 08/11/2015 1408   HGBUR NEGATIVE 08/11/2015 1408   BILIRUBINUR NEGATIVE 08/11/2015 1408   KETONESUR NEGATIVE 08/11/2015 1408   PROTEINUR 30 (A) 08/11/2015 1408   NITRITE NEGATIVE 08/11/2015 1408   LEUKOCYTESUR TRACE (A) 08/11/2015 1408   Sepsis Labs Recent Labs  Lab 05/23/22 1247  WBC 7.2   Microbiology No results found for this or any previous visit (from the past 240 hour(s)).   Total time spend on discharging this patient, including the last patient exam, discussing the hospital stay, instructions for ongoing care as it  relates to all pertinent caregivers, as well as preparing the medical discharge records, prescriptions, and/or referrals as applicable, is 35 minutes.    Enzo Bi, MD  [facility] 05/26/2022, 8:04 AM

## 2022-05-26 NOTE — Progress Notes (Addendum)
Patient discharging to Hospice facility. Report given to [place], Therapist, sports. Patient will discharge with IV in and foley.

## 2022-05-26 NOTE — Progress Notes (Signed)
[facility]) Hospice hospital liaison note   Referral received for Hospice Home. Met with patient and granddaughter in room. Patient has been approved for transfer to the Hospice Home.   Hospice home is able to accept patient at any time.    RN staff, you may call report at any time to [phone], room is assigned when report is called.  Please leave IV intact.    Please send completed DNR with patient.   Updated attending and [facility] manager via [place]. Thank you for the opportunity to participate in this patient's care Jhonnie Garner, BSN, RN Hospice nurse liaison [phone]

## 2022-05-26 NOTE — TOC Transition Note (Signed)
Transition of Care [facility]) - CM/SW Discharge Note   Patient Details  Name: Molly Fisher MRN: [id number] Date of Birth: [birth year]  Transition of Care [facility]) CM/SW Contact:  Laurena Slimmer, RN Phone Number: 05/26/2022, 9:37 AM   Clinical Narrative:    Patient approved for hospice home and is discharging today.  Face sheet and medical necessity forms printed to the floor to add to the EMS packet.   TOC signing off.          Patient Goals and CMS Choice        Discharge Placement                       Discharge Plan and Services                                     Social Determinants of Health (SDOH) Interventions     Readmission Risk Interventions     No data to display

## 2022-05-26 NOTE — Plan of Care (Signed)
  Problem: Coping: Goal: Ability to identify and develop effective coping behavior will improve Outcome: Not Progressing   Problem: Clinical Measurements: Goal: Quality of life will improve Outcome: Not Progressing   Problem: Education: Goal: Knowledge of the prescribed therapeutic regimen will improve Outcome: Not Progressing   Problem: Coping: Goal: Ability to identify and develop effective coping behavior will improve Outcome: Not Progressing   Problem: Clinical Measurements: Goal: Quality of life will improve Outcome: Not Progressing   Problem: Education: Goal: Knowledge of General Education information will improve Description: Including pain rating scale, medication(s)/side effects and non-pharmacologic comfort measures Outcome: Not Progressing   Problem: Health Behavior/Discharge Planning: Goal: Ability to manage health-related needs will improve Outcome: Not Progressing   Problem: Clinical Measurements: Goal: Ability to maintain clinical measurements within normal limits will improve Outcome: Not Progressing   Problem: Coping: Goal: Level of anxiety will decrease Outcome: Not Progressing   Problem: Safety: Goal: Ability to remain free from injury will improve Outcome: Not Progressing   Problem: Skin Integrity: Goal: Risk for impaired skin integrity will decrease Outcome: Not Progressing

## 2022-06-19 DEATH — deceased
